# Patient Record
Sex: Female | Born: 1960 | Race: Black or African American | Hispanic: No | Marital: Single | Smoking: Never smoker
Health system: Southern US, Community
[De-identification: ages and names within clinical notes are randomized; demographics above are authoritative.]

## PROBLEM LIST (undated history)

## (undated) DIAGNOSIS — H401113 Primary open-angle glaucoma, right eye, severe stage: Secondary | ICD-10-CM

## (undated) DIAGNOSIS — M25473 Effusion, unspecified ankle: Secondary | ICD-10-CM

## (undated) DIAGNOSIS — K219 Gastro-esophageal reflux disease without esophagitis: Secondary | ICD-10-CM

## (undated) DIAGNOSIS — I10 Essential (primary) hypertension: Secondary | ICD-10-CM

## (undated) DIAGNOSIS — T7840XA Allergy, unspecified, initial encounter: Secondary | ICD-10-CM

## (undated) DIAGNOSIS — J45909 Unspecified asthma, uncomplicated: Secondary | ICD-10-CM

## (undated) HISTORY — PX: TUBAL LIGATION: SHX77

## (undated) HISTORY — DX: Allergy, unspecified, initial encounter: T78.40XA

## (undated) HISTORY — DX: Effusion, unspecified ankle: M25.473

## (undated) HISTORY — PX: ABDOMINAL SURGERY: SHX537

## (undated) HISTORY — DX: Primary open-angle glaucoma, right eye, severe stage: H40.1113

---

## 1988-12-20 HISTORY — PX: TUBAL LIGATION: SHX77

## 1996-12-20 HISTORY — PX: ABDOMINAL SURGERY: SHX537

## 1998-02-13 ENCOUNTER — Ambulatory Visit (HOSPITAL_COMMUNITY): Admission: RE | Admit: 1998-02-13 | Discharge: 1998-02-13 | Payer: Self-pay | Admitting: Obstetrics

## 1998-06-27 ENCOUNTER — Inpatient Hospital Stay (HOSPITAL_COMMUNITY): Admission: RE | Admit: 1998-06-27 | Discharge: 1998-06-29 | Payer: Self-pay | Admitting: Obstetrics

## 1999-09-10 ENCOUNTER — Other Ambulatory Visit: Admission: RE | Admit: 1999-09-10 | Discharge: 1999-09-10 | Payer: Self-pay | Admitting: Obstetrics

## 2001-01-04 ENCOUNTER — Other Ambulatory Visit: Admission: RE | Admit: 2001-01-04 | Discharge: 2001-01-04 | Payer: Self-pay | Admitting: Obstetrics

## 2001-03-29 ENCOUNTER — Encounter (INDEPENDENT_AMBULATORY_CARE_PROVIDER_SITE_OTHER): Payer: Self-pay | Admitting: *Deleted

## 2001-03-29 ENCOUNTER — Ambulatory Visit (HOSPITAL_BASED_OUTPATIENT_CLINIC_OR_DEPARTMENT_OTHER): Admission: RE | Admit: 2001-03-29 | Discharge: 2001-03-29 | Payer: Self-pay | Admitting: Orthopedic Surgery

## 2002-06-06 ENCOUNTER — Encounter: Payer: Self-pay | Admitting: Obstetrics

## 2002-06-06 ENCOUNTER — Ambulatory Visit (HOSPITAL_COMMUNITY): Admission: RE | Admit: 2002-06-06 | Discharge: 2002-06-06 | Payer: Self-pay | Admitting: Obstetrics

## 2005-01-19 ENCOUNTER — Encounter: Admission: RE | Admit: 2005-01-19 | Discharge: 2005-01-19 | Payer: Self-pay | Admitting: Obstetrics

## 2005-01-21 ENCOUNTER — Encounter: Admission: RE | Admit: 2005-01-21 | Discharge: 2005-01-21 | Payer: Self-pay | Admitting: Interventional Radiology

## 2005-01-26 ENCOUNTER — Ambulatory Visit (HOSPITAL_COMMUNITY): Admission: RE | Admit: 2005-01-26 | Discharge: 2005-01-26 | Payer: Self-pay | Admitting: Obstetrics

## 2005-02-09 ENCOUNTER — Ambulatory Visit (HOSPITAL_COMMUNITY): Admission: RE | Admit: 2005-02-09 | Discharge: 2005-02-09 | Payer: Self-pay | Admitting: Interventional Radiology

## 2005-02-11 ENCOUNTER — Observation Stay (HOSPITAL_COMMUNITY): Admission: RE | Admit: 2005-02-11 | Discharge: 2005-02-12 | Payer: Self-pay | Admitting: Interventional Radiology

## 2006-04-28 ENCOUNTER — Emergency Department (HOSPITAL_COMMUNITY): Admission: EM | Admit: 2006-04-28 | Discharge: 2006-04-28 | Payer: Self-pay | Admitting: Emergency Medicine

## 2008-01-29 ENCOUNTER — Emergency Department (HOSPITAL_COMMUNITY): Admission: EM | Admit: 2008-01-29 | Discharge: 2008-01-29 | Payer: Self-pay | Admitting: Family Medicine

## 2010-07-02 ENCOUNTER — Emergency Department (HOSPITAL_COMMUNITY)
Admission: EM | Admit: 2010-07-02 | Discharge: 2010-07-02 | Payer: Self-pay | Admitting: Physical Medicine and Rehabilitation

## 2011-01-10 ENCOUNTER — Encounter: Payer: Self-pay | Admitting: Obstetrics

## 2011-05-07 NOTE — Op Note (Signed)
[place]  Patient:    DESHARA, ROSSI                        MRN: [id number] Proc. Date: 03/29/01 Attending:  Artist Pais. Mina Marble, M.D.                           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  OPERATIVE PROCEDURE:  Scar revision right wrist as well as excision of recurrent dorsal ganglion cyst right wrist.  SURGEON:  Artist Pais. Mina Marble, M.D.  ASSISTANT:  Junius Roads. Ireton, P.A.C.  ANESTHESIA:  Bier block.  TOURNIQUET TIME:   40 minutes.  COMPLICATIONS:  None.  DRAINS/TUBES:  None.  SPECIMENS:  One specimen sent.  DESCRIPTION OF PROCEDURE:  The patient was taken to the operating room and after the induction of adequate Bier block analgesia, the right upper extremity was prepped and draped in the usual sterile fashion. At this point in time after adequate analgesia was obtained, a hypertrophic scar over the dorsal aspect of the right wrist was ellipsed out with undermining the skin edges to provide enough room for subsequent closure. At this point in time dissection was carried down to recurrent cyst overlying what appeared to be coming from the [facility] joint. Dissection was carried down. The cyst was significantly involved with the extensor carpi radialis brevis insertion. Dissection was carried down to the stalk which was excised. Once the stalk was excised the bipolar cautery was used to achieve hemostasis. The wound was thoroughly irrigated and then closed with a running 3-0 Prolene subcuticular stitch. Steri-Strips, 4 x 4s, fluffs and compressive dressing was applied. The patient had the specimen sent for pathologic confirmation as well as scar excision. The patient tolerated the procedure well and was placed in a well padded splint and a sterile dressing and went to the  recovery room in stable fashion. DD:  03/29/01 TD:  03/29/01 Job: [id number]

## 2012-07-06 ENCOUNTER — Other Ambulatory Visit (HOSPITAL_COMMUNITY)
Admission: RE | Admit: 2012-07-06 | Discharge: 2012-07-06 | Disposition: A | Discharge status: Home / Self Care | Payer: BC Managed Care – PPO | Source: Ambulatory Visit | Attending: Family Medicine | Admitting: Family Medicine

## 2012-07-06 ENCOUNTER — Other Ambulatory Visit: Payer: Self-pay | Admitting: Family Medicine

## 2012-07-06 DIAGNOSIS — Z1151 Encounter for screening for human papillomavirus (HPV): Secondary | ICD-10-CM | POA: Insufficient documentation

## 2012-07-06 DIAGNOSIS — Z01419 Encounter for gynecological examination (general) (routine) without abnormal findings: Secondary | ICD-10-CM | POA: Insufficient documentation

## 2013-03-28 ENCOUNTER — Other Ambulatory Visit: Payer: Self-pay | Admitting: Orthopedic Surgery

## 2013-03-28 ENCOUNTER — Ambulatory Visit
Admission: RE | Admit: 2013-03-28 | Discharge: 2013-03-28 | Disposition: A | Discharge status: Home / Self Care | Payer: BC Managed Care – PPO | Source: Ambulatory Visit | Attending: Orthopedic Surgery | Admitting: Orthopedic Surgery

## 2013-03-28 DIAGNOSIS — M25562 Pain in left knee: Secondary | ICD-10-CM

## 2013-03-28 DIAGNOSIS — M25561 Pain in right knee: Secondary | ICD-10-CM

## 2013-12-25 ENCOUNTER — Other Ambulatory Visit (HOSPITAL_COMMUNITY): Payer: Self-pay | Admitting: Family Medicine

## 2013-12-25 DIAGNOSIS — Z1231 Encounter for screening mammogram for malignant neoplasm of breast: Secondary | ICD-10-CM

## 2014-01-07 ENCOUNTER — Ambulatory Visit (HOSPITAL_COMMUNITY)
Admission: RE | Admit: 2014-01-07 | Discharge: 2014-01-07 | Disposition: A | Discharge status: Home / Self Care | Payer: BC Managed Care – PPO | Source: Ambulatory Visit | Attending: Family Medicine | Admitting: Family Medicine

## 2014-01-07 DIAGNOSIS — Z1231 Encounter for screening mammogram for malignant neoplasm of breast: Secondary | ICD-10-CM

## 2014-02-26 ENCOUNTER — Other Ambulatory Visit: Payer: Self-pay | Admitting: Family Medicine

## 2014-02-26 ENCOUNTER — Other Ambulatory Visit (HOSPITAL_COMMUNITY)
Admission: RE | Admit: 2014-02-26 | Discharge: 2014-02-26 | Disposition: A | Discharge status: Home / Self Care | Payer: BC Managed Care – PPO | Source: Ambulatory Visit | Attending: Family Medicine | Admitting: Family Medicine

## 2014-02-26 DIAGNOSIS — Z01419 Encounter for gynecological examination (general) (routine) without abnormal findings: Secondary | ICD-10-CM | POA: Insufficient documentation

## 2015-09-14 ENCOUNTER — Emergency Department (INDEPENDENT_AMBULATORY_CARE_PROVIDER_SITE_OTHER)
Admission: EM | Admit: 2015-09-14 | Discharge: 2015-09-14 | Disposition: A | Discharge status: Home / Self Care | Payer: BLUE CROSS/BLUE SHIELD | Source: Home / Self Care | Attending: Emergency Medicine | Admitting: Emergency Medicine

## 2015-09-14 ENCOUNTER — Encounter (HOSPITAL_COMMUNITY): Payer: Self-pay | Admitting: *Deleted

## 2015-09-14 DIAGNOSIS — T783XXA Angioneurotic edema, initial encounter: Secondary | ICD-10-CM

## 2015-09-14 HISTORY — DX: Gastro-esophageal reflux disease without esophagitis: K21.9

## 2015-09-14 HISTORY — DX: Essential (primary) hypertension: I10

## 2015-09-14 HISTORY — DX: Unspecified asthma, uncomplicated: J45.909

## 2015-09-14 MED ORDER — METHYLPREDNISOLONE SODIUM SUCC 125 MG IJ SOLR
125.0000 mg | Freq: Once | INTRAMUSCULAR | Status: AC
  Administered 2015-09-14: 125 mg via INTRAMUSCULAR

## 2015-09-14 MED ORDER — METHYLPREDNISOLONE SODIUM SUCC 125 MG IJ SOLR
INTRAMUSCULAR | Status: AC
  Filled 2015-09-14: qty 2

## 2015-09-14 MED ORDER — PREDNISONE 10 MG PO TABS: ORAL_TABLET | ORAL | Status: DC

## 2015-09-14 MED ORDER — HYDROCHLOROTHIAZIDE 25 MG PO TABS: 25.0000 mg | ORAL_TABLET | Freq: Every day | ORAL | Status: DC

## 2015-09-14 MED ORDER — RANITIDINE HCL 150 MG PO CAPS: 150.0000 mg | ORAL_CAPSULE | Freq: Two times a day (BID) | ORAL | Status: DC

## 2015-09-14 MED ORDER — EPINEPHRINE 0.3 MG/0.3ML IJ SOAJ: 0.3000 mg | INTRAMUSCULAR | Status: DC | PRN

## 2015-09-14 MED ORDER — DESLORATADINE 5 MG PO TABS: 5.0000 mg | ORAL_TABLET | Freq: Every day | ORAL | Status: DC

## 2015-09-14 NOTE — Discharge Instructions (Signed)
You are having a reaction called angioedema. This may be from your lisinopril. Please stop the lisinopril. I've provided a prescription for hydrochlorothiazide to take daily. Continue the amlodipine. Please take the prednisone as prescribed. Take Clarinex daily for the next week. Take Zantac twice a day for the next week. Please follow-up with your doctor tomorrow or Tuesday. If you develop swelling of your mouth or throat, use the EpiPen and go directly to the emergency room.   Angioedema Angioedema is sudden puffiness (swelling), often of the skin. It can happen:  On your face or privates (genitals).  In your belly (abdomen) or other body parts. It usually happens quickly and gets better in 1 or 2 days. It often starts at night and is found when you wake up. You may get red, itchy patches of skin (hives). Attacks can be dangerous if your breathing passages get puffy. The condition may happen only once, or it can come back at random times. It may happen for several years before it goes away for good. HOME CARE  Only take medicines as told by your doctor.  Always carry your emergency allergy medicines with you.  Wear a medical bracelet as told by your doctor.  Avoid things that you know will cause attacks (triggers). GET HELP IF:  You have another attack.  Your attacks happen more often or get worse.  The condition was passed to you by your parents and you want to have children. GET HELP RIGHT AWAY IF:   Your mouth, tongue, or lips are very puffy.  You have trouble breathing.  You have trouble swallowing.  You pass out (faint). MAKE SURE YOU:   Understand these instructions.  Will watch your condition.  Will get help right away if you are not doing well or get worse. Document Released: 11/24/2009 Document Revised: 09/26/2013 Document Reviewed: 07/30/2013 [facility] Patient Information 2015 [place], [place]. This information is not intended to replace advice given to  you by your health care provider. Make sure you discuss any questions you have with your health care provider.

## 2015-09-14 NOTE — ED Provider Notes (Signed)
CSN: [id number]     Arrival date & time 09/14/15  1721 History   First MD Initiated Contact with Patient 09/14/15 1745     Chief Complaint  Patient presents with  . Angioedema   (Consider location/radiation/quality/duration/timing/severity/associated sxs/prior Treatment) HPI She is a 54 year old woman here for evaluation of facial swelling. She states this started this morning with swelling of her left cheek and jaw area. It progressed to involve the left side of her lower and upper lips. She denies any tongue swelling or difficulty with her airway. She has tried taking Benadryl without improvement. No new medications, but her amlodipine was increased from 5 mg daily to 10 mg daily 2 days ago. She is on a lisinopril/HCTZ combo pill.  Past Medical History  Diagnosis Date  . Hypertension   . Asthma   . GERD (gastroesophageal reflux disease)    History reviewed. No pertinent past surgical history. No family history on file. Social History  Substance Use Topics  . Smoking status: Never Smoker   . Smokeless tobacco: None  . Alcohol Use: Yes     Comment: occasional   OB History    No data available     Review of Systems As in history of present illness Allergies  Review of patient's allergies indicates no known allergies.  Home Medications   Prior to Admission medications   Medication Sig Start Date End Date Taking? Authorizing Provider  AMLODIPINE BESYLATE PO Take by mouth. unk dose; recent increase in dose 2 days ago   Yes Historical Provider, MD  Budesonide-Formoterol Fumarate (SYMBICORT IN) Inhale 2 puffs into the lungs 2 (two) times daily.   Yes Historical Provider, MD  desloratadine (CLARINEX) 5 MG tablet Take 1 tablet (5 mg total) by mouth daily. 09/14/15   Melony Overly, MD  EPINEPHrine 0.3 mg/0.3 mL IJ SOAJ injection Inject 0.3 mLs (0.3 mg total) into the muscle as needed. 09/14/15   Melony Overly, MD  hydrochlorothiazide (HYDRODIURIL) 25 MG tablet Take 1 tablet (25 mg  total) by mouth daily. 09/14/15   Melony Overly, MD  predniSONE (DELTASONE) 10 MG tablet Day 1: 4 tablets Day 2: 4 tablets Day 3: 3 tablets Day 4: 2 tablets Day 5: 1 tablet 09/14/15   Melony Overly, MD  ranitidine (ZANTAC) 150 MG capsule Take 1 capsule (150 mg total) by mouth 2 (two) times daily. 09/14/15   Melony Overly, MD   Meds Ordered and Administered this Visit   Medications  methylPREDNISolone sodium succinate (SOLU-MEDROL) 125 mg/2 mL injection 125 mg (not administered)    BP 154/72 mmHg  Pulse 82  Temp(Src) 98 F (36.7 C) (Oral)  Resp 22  SpO2 99%  LMP  (Approximate) No data found.   Physical Exam  Constitutional: She is oriented to person, place, and time. She appears well-developed and well-nourished. No distress.  HENT:  Mouth/Throat: Oropharynx is clear and moist. No oropharyngeal exudate.  There is swelling to her left cheek, left lower lip, and left upper lip. No tongue or uvula swelling. Her airway is widely patent.  Neck: Neck supple.  Cardiovascular: Normal rate.   Pulmonary/Chest: Effort normal.  Neurological: She is alert and oriented to person, place, and time.    ED Course  Procedures (including critical care time)  Labs Review Labs Reviewed - No data to display  Imaging Review No results found.    MDM   1. Angioedema of lips, initial encounter    Discussed that this is most typically  caused by ace-inhibitors. I've instructed her to stop her lisinopril. I provided a prescription for hydrochlorothiazide 25 mg tablets. Solu-Medrol given here. Discharged with prednisone taper, Clarinex, and Zantac. I also provided a prescription for an EpiPen to be used if she develops swelling of her tongue or airway. Strict return precautions reviewed. Follow-up with PCP in 2-3 days for recheck.    Melony Overly, MD 09/14/15 [phone]

## 2015-09-14 NOTE — ED Notes (Signed)
Started with left facial swelling late this morning, which has progressed into left side of upper and lower lips.  Denies any tongue swelling, denies any throat swelling or unusual oral sensations or pruritis.  Took 50mg  Benadryl @ 1100, another 25mg  @ 1230, and another 25mg  @ 1500 without relief.  Pt takes lisinopril and amlodipine; amlodipine dose was increased by PCP 2 days ago.

## 2015-12-05 ENCOUNTER — Other Ambulatory Visit: Payer: Self-pay | Admitting: Allergy and Immunology

## 2015-12-26 ENCOUNTER — Other Ambulatory Visit: Payer: Self-pay | Admitting: Allergy and Immunology

## 2016-02-03 ENCOUNTER — Encounter (HOSPITAL_COMMUNITY): Payer: Self-pay | Admitting: Neurology

## 2016-02-03 ENCOUNTER — Ambulatory Visit: Payer: Self-pay | Admitting: Allergy and Immunology

## 2016-02-03 ENCOUNTER — Emergency Department (HOSPITAL_COMMUNITY)
Admission: EM | Admit: 2016-02-03 | Discharge: 2016-02-03 | Disposition: A | Discharge status: Home / Self Care | Payer: BLUE CROSS/BLUE SHIELD | Attending: Emergency Medicine | Admitting: Emergency Medicine

## 2016-02-03 DIAGNOSIS — Z79899 Other long term (current) drug therapy: Secondary | ICD-10-CM | POA: Diagnosis not present

## 2016-02-03 DIAGNOSIS — K219 Gastro-esophageal reflux disease without esophagitis: Secondary | ICD-10-CM | POA: Insufficient documentation

## 2016-02-03 DIAGNOSIS — I10 Essential (primary) hypertension: Secondary | ICD-10-CM | POA: Diagnosis not present

## 2016-02-03 DIAGNOSIS — Z7952 Long term (current) use of systemic steroids: Secondary | ICD-10-CM | POA: Diagnosis not present

## 2016-02-03 DIAGNOSIS — J4521 Mild intermittent asthma with (acute) exacerbation: Secondary | ICD-10-CM | POA: Insufficient documentation

## 2016-02-03 DIAGNOSIS — R0602 Shortness of breath: Secondary | ICD-10-CM | POA: Diagnosis present

## 2016-02-03 MED ORDER — ALBUTEROL SULFATE (2.5 MG/3ML) 0.083% IN NEBU
5.0000 mg | INHALATION_SOLUTION | Freq: Once | RESPIRATORY_TRACT | Status: AC
  Administered 2016-02-03: 5 mg via RESPIRATORY_TRACT

## 2016-02-03 MED ORDER — ALBUTEROL SULFATE (2.5 MG/3ML) 0.083% IN NEBU
INHALATION_SOLUTION | RESPIRATORY_TRACT | Status: DC
Start: 2016-02-03 — End: 2016-02-03
  Filled 2016-02-03: qty 6

## 2016-02-03 MED ORDER — PREDNISONE 20 MG PO TABS: 40.0000 mg | ORAL_TABLET | Freq: Every day | ORAL | Status: DC

## 2016-02-03 NOTE — ED Notes (Signed)
Pt reports URI, has been having asthma flares since last week. This morning felt sob, used symbicort and albuterol. Pt is able to speak in complete sentences 96% RA. Lung sounds clear.

## 2016-02-03 NOTE — ED Provider Notes (Signed)
CSN: [id number]     Arrival date & time 02/03/16  [id number] History  By signing my name below, I, Evelene Croon, attest that this documentation has been prepared under the direction and in the presence of non-physician practitioner, Montine Circle, PA-C. Electronically Signed: Evelene Croon, Scribe. 02/03/2016. 10:00 AM.    Chief Complaint  Patient presents with  . Asthma    The history is provided by the patient. No language interpreter was used.   HPI Comments:  Amy Combs is a 55 y.o. female with a history of asthma, who presents to the Emergency Department complaining of SOB with associated chest tightness which began 1 week ago but worsened this am. She denies CP specifically and  notes her symptoms today are typical of her asthma exacerbations. Pt used inhaler with minimal relief. She denies any productive cough or fever.  Past Medical History  Diagnosis Date  . Hypertension   . Asthma   . GERD (gastroesophageal reflux disease)    History reviewed. No pertinent past surgical history. No family history on file. Social History  Substance Use Topics  . Smoking status: Never Smoker   . Smokeless tobacco: None  . Alcohol Use: Yes     Comment: occasional   OB History    No data available     Review of Systems  Constitutional: Negative for fever and chills.  Respiratory: Positive for chest tightness and shortness of breath.   Cardiovascular: Negative for chest pain.     Allergies  Review of patient's allergies indicates no known allergies.  Home Medications   Prior to Admission medications   Medication Sig Start Date End Date Taking? Authorizing Provider  AMLODIPINE BESYLATE PO Take by mouth. unk dose; recent increase in dose 2 days ago    Historical Provider, MD  Budesonide-Formoterol Fumarate (SYMBICORT IN) Inhale 2 puffs into the lungs 2 (two) times daily.    Historical Provider, MD  desloratadine (CLARINEX) 5 MG tablet Take 1 tablet (5 mg total) by mouth daily.  09/14/15   Melony Overly, MD  EPINEPHrine 0.3 mg/0.3 mL IJ SOAJ injection Inject 0.3 mLs (0.3 mg total) into the muscle as needed. 09/14/15   Melony Overly, MD  hydrochlorothiazide (HYDRODIURIL) 25 MG tablet Take 1 tablet (25 mg total) by mouth daily. 09/14/15   Melony Overly, MD  pantoprazole (PROTONIX) 40 MG tablet TAKE 1 TABLET EVERY MORNING FOR REFLUX 12/05/15   Jiles Prows, MD  predniSONE (DELTASONE) 10 MG tablet Day 1: 4 tablets Day 2: 4 tablets Day 3: 3 tablets Day 4: 2 tablets Day 5: 1 tablet 09/14/15   Melony Overly, MD  ranitidine (ZANTAC) 150 MG capsule Take 1 capsule (150 mg total) by mouth 2 (two) times daily. 09/14/15   Melony Overly, MD  ranitidine (ZANTAC) 300 MG tablet TAKE 1 TABLET EVERY EVENING AS DIRECTED FOR REFLUX 12/05/15   Jiles Prows, MD  SYMBICORT 160-4.5 MCG/ACT inhaler USE 2 INHALATIONS TWICE A DAY TO PREVENT COUGH AND WHEEZING, RINSE, GARGLE, AND SPIT AFTER USE 12/26/15   Jiles Prows, MD   BP 157/95 mmHg  Pulse 83  Temp(Src) 97.7 F (36.5 C) (Oral)  Resp 18  SpO2 96% Physical Exam  Constitutional: She is oriented to person, place, and time. She appears well-developed and well-nourished. No distress.  HENT:  Head: Normocephalic and atraumatic.  Eyes: Conjunctivae are normal.  Cardiovascular: Normal rate and normal heart sounds.   Pulmonary/Chest: Effort normal and breath sounds normal. No respiratory  distress. She has no wheezes. She has no rales.  No wheezing. CTA bilaterally.  Abdominal: She exhibits no distension.  Neurological: She is alert and oriented to person, place, and time.  Skin: Skin is warm and dry.  Psychiatric: She has a normal mood and affect.  Nursing note and vitals reviewed.   ED Course  Procedures   DIAGNOSTIC STUDIES:  Oxygen Saturation is 96% on RA, normal by my interpretation.    COORDINATION OF CARE:  10:12 AM Pt feels better after neb tx. Discussed treatment plan with pt at bedside and pt agreed to plan.  MDM   Final  diagnoses:  Asthma, mild intermittent, with acute exacerbation    Patient with mild signs and symptoms of asthma/RAD. Oxygen saturation is above 90%. No accessory muscle use, no cyanosis. Pt treated in the ED and feels improved after treatment. Will discharge with prednisone. Pt instructed to follow up with PCP. Patient hemodynamically stable. Discussed return precautions. Pt appears safe for discharge.    I personally performed the services described in this documentation, which was scribed in my presence. The recorded information has been reviewed and is accurate.      Montine Circle, PA-C 02/03/16 1038  Elnora Morrison, MD 02/03/16 [phone]

## 2016-02-03 NOTE — Discharge Instructions (Signed)

## 2016-02-10 ENCOUNTER — Ambulatory Visit (INDEPENDENT_AMBULATORY_CARE_PROVIDER_SITE_OTHER): Payer: BLUE CROSS/BLUE SHIELD | Admitting: Allergy and Immunology

## 2016-02-10 ENCOUNTER — Encounter: Payer: Self-pay | Admitting: Allergy and Immunology

## 2016-02-10 VITALS — BP 120/78 | HR 72 | Resp 16 | Ht 66.14 in | Wt 230.8 lb

## 2016-02-10 DIAGNOSIS — J309 Allergic rhinitis, unspecified: Secondary | ICD-10-CM

## 2016-02-10 DIAGNOSIS — J4540 Moderate persistent asthma, uncomplicated: Secondary | ICD-10-CM | POA: Diagnosis not present

## 2016-02-10 DIAGNOSIS — J387 Other diseases of larynx: Secondary | ICD-10-CM

## 2016-02-10 DIAGNOSIS — H1010 Acute atopic conjunctivitis, unspecified eye: Secondary | ICD-10-CM | POA: Diagnosis not present

## 2016-02-10 DIAGNOSIS — K219 Gastro-esophageal reflux disease without esophagitis: Secondary | ICD-10-CM

## 2016-02-10 MED ORDER — MONTELUKAST SODIUM 10 MG PO TABS: 10.0000 mg | ORAL_TABLET | Freq: Every day | ORAL | Status: DC

## 2016-02-10 MED ORDER — ALBUTEROL SULFATE (2.5 MG/3ML) 0.083% IN NEBU: INHALATION_SOLUTION | RESPIRATORY_TRACT | Status: DC

## 2016-02-10 MED ORDER — ALBUTEROL SULFATE HFA 108 (90 BASE) MCG/ACT IN AERS: INHALATION_SPRAY | RESPIRATORY_TRACT | Status: DC

## 2016-02-10 NOTE — Patient Instructions (Addendum)
  1. Symbicort 160 2 inhalations twice a day  2. Montelukast 10 mg one tablet one time per day  3. Continue pantoprazole 40 mg in the morning and ranitidine 300 mg in evening  4. Continue ProAir HFA or albuterol nebulization every 4-6 hours if needed  5. "Action plan" for asthma flare up:   A. continue Symbicort 160 2 inhalations twice a day  B. add Qvar 80 3 inhalations 3 times a day  C. use ProAir HFA or albuterol nebulization if needed  6. Annual fall flu vaccine every year  7. Return to clinic in 3 months or earlier if problem

## 2016-02-10 NOTE — Progress Notes (Signed)
Follow-up Note  Referring Provider: No ref. provider found Primary Provider: Elyn Peers, MD Date of Office Visit: 02/10/2016  Subjective:   Amy Combs (DOB: 1961-09-15) is a 55 y.o. female who returns to the [facility] [place] on 02/10/2016 in re-evaluation of the following:  HPI Comments: Amy Combs presents to this clinic in reevaluation of her asthma and allergic rhinoconjunctivitis and LPR. She had an exacerbation of her asthma requiring her to go to the emergency room last week manifested as wheezing and coughing and nonresponsiveness to her short-acting bronchodilator. That was her first major flareups requiring a systemic steroid over the course of the past 6 months. She responded quite well to systemic steroids and is much better today. There was no obvious trigger giving rise to that flareup although she did have problems with rhinitis. She's been consistently using her Symbicort but did not have any additional inhaled steroid to add and when she flared up. She continues on pantoprazole and ranitidine for her reflux which is under very good control. She does not use a nasal steroid by preference at this point in time but overall she thinks her nose is doing relatively well. She would like to get a nebulizer for home.    Current outpatient prescriptions:  .  albuterol (PROAIR HFA) 108 (90 Base) MCG/ACT inhaler, Inhale two puffs every four to six hours as needed for cough or wheeze., Disp: , Rfl:  .  amLODipine (NORVASC) 10 MG tablet, Take 10 mg by mouth daily., Disp: , Rfl:  .  EDARBYCLOR 40-25 MG TABS, Take 1 tablet by mouth daily., Disp: , Rfl:  .  indapamide (LOZOL) 2.5 MG tablet, Take 2.5 mg by mouth daily., Disp: , Rfl: 3 .  latanoprost (XALATAN) 0.005 % ophthalmic solution, , Disp: , Rfl:  .  Multiple Vitamins-Minerals (ONE DAILY FOR WOMEN PO), Take by mouth daily., Disp: , Rfl:  .  pantoprazole (PROTONIX) 40 MG tablet, TAKE 1 TABLET EVERY MORNING FOR REFLUX,  Disp: 90 tablet, Rfl: 0 .  ranitidine (ZANTAC) 300 MG tablet, TAKE 1 TABLET EVERY EVENING AS DIRECTED FOR REFLUX, Disp: 90 tablet, Rfl: 0 .  SYMBICORT 160-4.5 MCG/ACT inhaler, USE 2 INHALATIONS TWICE A DAY TO PREVENT COUGH AND WHEEZING, RINSE, GARGLE, AND SPIT AFTER USE, Disp: 30.6 g, Rfl: 0 .  [DISCONTINUED] LISINOPRIL PO, Take by mouth., Disp: , Rfl:   Meds ordered this encounter  Medications  . albuterol (PROVENTIL) (2.5 MG/3ML) 0.083% nebulizer solution    Sig: Use one vial in nebulizer every 4-6 hours if needed for cough or wheeze    Dispense:  150 mL    Refill:  1  . montelukast (SINGULAIR) 10 MG tablet    Sig: Take 1 tablet (10 mg total) by mouth at bedtime.    Dispense:  30 tablet    Refill:  5  . albuterol (PROAIR HFA) 108 (90 Base) MCG/ACT inhaler    Sig: Inhale two puffs every four to six hours as needed for cough or wheeze.    Dispense:  3 Inhaler    Refill:  0    Past Medical History  Diagnosis Date  . Hypertension   . Asthma   . GERD (gastroesophageal reflux disease)     Past Surgical History  Procedure Laterality Date  . Tubal ligation      Allergies  Allergen Reactions  . Lisinopril Swelling    Review of systems negative except as noted in HPI / PMHx or noted below:  Review  of Systems  Constitutional: Negative.   HENT: Negative.   Eyes: Negative.   Respiratory: Negative.   Cardiovascular: Negative.   Gastrointestinal: Negative.   Genitourinary: Negative.   Musculoskeletal: Negative.   Skin: Negative.   Neurological: Negative.   Endo/Heme/Allergies: Negative.   Psychiatric/Behavioral: Negative.      Objective:   Filed Vitals:   02/10/16 1719  BP: 120/78  Pulse: 72  Resp: 16   Height: 5' 6.14" (168 cm)  Weight: 230 lb 13.2 oz (104.7 kg)   Physical Exam  Constitutional: She is well-developed, well-nourished, and in no distress.  HENT:  Head: Normocephalic.  Right Ear: Tympanic membrane, external ear and ear canal normal.  Left Ear:  Tympanic membrane, external ear and ear canal normal.  Nose: Nose normal. No mucosal edema or rhinorrhea.  Mouth/Throat: Uvula is midline, oropharynx is clear and moist and mucous membranes are normal. No oropharyngeal exudate.  Eyes: Conjunctivae are normal.  Neck: Trachea normal. No tracheal tenderness present. No tracheal deviation present. No thyromegaly present.  Cardiovascular: Normal rate, regular rhythm, S1 normal, S2 normal and normal heart sounds.   No murmur heard. Pulmonary/Chest: Breath sounds normal. No stridor. No respiratory distress. She has no wheezes. She has no rales.  Musculoskeletal: She exhibits no edema.  Lymphadenopathy:       Head (right side): No tonsillar adenopathy present.       Head (left side): No tonsillar adenopathy present.    She has no cervical adenopathy.    She has no axillary adenopathy.  Neurological: She is alert. Gait normal.  Skin: No rash noted. She is not diaphoretic. No erythema. Nails show no clubbing.  Psychiatric: Mood and affect normal.    Diagnostics:    Spirometry was performed and demonstrated an FEV1 of 1.70 at 77 % of predicted.  The patient had an Asthma Control Test with the following results: ACT Total Score: 14.    Assessment and Plan:   1. Asthma, moderate persistent, well-controlled   2. Allergic rhinoconjunctivitis   3. LPRD (laryngopharyngeal reflux disease)     1. Symbicort 160 2 inhalations twice a day  2. Montelukast 10 mg one tablet one time per day  3. Continue pantoprazole 40 mg in the morning and ranitidine 300 mg in evening  4. Continue ProAir HFA or albuterol nebulization every 4-6 hours if needed  5. "Action plan" for asthma flare up:   A. continue Symbicort 160 2 inhalations twice a day  B. add Qvar 80 3 inhalations 3 times a day  C. use ProAir HFA or albuterol nebulization if needed  6. Annual fall flu vaccine every year  7. Return to clinic in 3 months or earlier if problem  I will assume  that Amy Combs will do well as we progress through this upcoming springtime season on the plan mentioned above. We'll have her continue to use a combination of anti-inflammatory medications for her respiratory tract including Symbicort and montelukast and will add in high-dose inhaled steroid if she develops increased asthma activity in the future. She has a difficult time the spring we may need to have her reevaluated for different form of therapy including possible immunotherapy or biological agents. We'll see if things go over the course the next several months.  [name], MD [place]

## 2016-02-26 ENCOUNTER — Telehealth: Payer: Self-pay | Admitting: Allergy and Immunology

## 2016-02-26 NOTE — Telephone Encounter (Signed)
Received bill in mail, asking about the discount.

## 2016-02-27 NOTE — Telephone Encounter (Signed)
Left message to call me back.

## 2016-03-04 ENCOUNTER — Other Ambulatory Visit: Payer: Self-pay | Admitting: Allergy and Immunology

## 2016-03-16 NOTE — Telephone Encounter (Signed)
Sending payment - will give her discount on mm

## 2016-04-07 ENCOUNTER — Ambulatory Visit (HOSPITAL_COMMUNITY)
Admission: EM | Admit: 2016-04-07 | Discharge: 2016-04-07 | Disposition: A | Discharge status: Home / Self Care | Payer: BLUE CROSS/BLUE SHIELD | Attending: Emergency Medicine | Admitting: Emergency Medicine

## 2016-04-07 ENCOUNTER — Encounter (HOSPITAL_COMMUNITY): Payer: Self-pay | Admitting: Emergency Medicine

## 2016-04-07 DIAGNOSIS — R590 Localized enlarged lymph nodes: Secondary | ICD-10-CM

## 2016-04-07 DIAGNOSIS — R599 Enlarged lymph nodes, unspecified: Secondary | ICD-10-CM | POA: Diagnosis not present

## 2016-04-07 MED ORDER — CLINDAMYCIN HCL 300 MG PO CAPS: 300.0000 mg | ORAL_CAPSULE | Freq: Three times a day (TID) | ORAL | Status: DC

## 2016-04-07 NOTE — ED Notes (Signed)
Pt has a swollen area in front of left ear. Suspected swollen lymph nose. Area is tender to palpation. PT first noticed area on Sunday.

## 2016-04-07 NOTE — Discharge Instructions (Signed)
You have 2 enlarged lymph nodes by your ear. Take clindamycin 3 times a day for the next 10 days. This is to cover infection of the lymph nodes.  This will likely cause some loose stool or diarrhea while taking it. Please make an appointment to see your PCP in 1-2 weeks for a recheck. The swelling is getting worse, you develop fevers, or you have difficulty breathing or swallowing, please go to the emergency room.

## 2016-04-07 NOTE — ED Provider Notes (Signed)
CSN: [id number]     Arrival date & time 04/07/16  1308 History   First MD Initiated Contact with Patient 04/07/16 1347     Chief Complaint  Patient presents with  . Mass    Swollen area in front of left ear. Painful to palpation   (Consider location/radiation/quality/duration/timing/severity/associated sxs/prior Treatment) HPI She is a 55 year old woman here for evaluation of mass.  She first noticed some swelling in front of her left ear 3 days ago. It is tender to palpation. It seemed to get worse, but then wrote down again.She denies any fevers. No ear pain, nasal congestion, rhinorrhea, sore throat. No recent cold symptoms. No difficulty swallowing or breathing. She has not tried anything.  Past Medical History  Diagnosis Date  . Hypertension   . Asthma   . GERD (gastroesophageal reflux disease)    Past Surgical History  Procedure Laterality Date  . Tubal ligation     Family History  Problem Relation Age of Onset  . High blood pressure Mother   . High blood pressure Father    Social History  Substance Use Topics  . Smoking status: Never Smoker   . Smokeless tobacco: Never Used  . Alcohol Use: Yes     Comment: occasional   OB History    No data available     Review of Systems As in history of present illness Allergies  Lisinopril  Home Medications   Prior to Admission medications   Medication Sig Start Date End Date Taking? Authorizing Provider  amLODipine (NORVASC) 10 MG tablet Take 10 mg by mouth daily. 12/25/15  Yes Historical Provider, MD  EDARBYCLOR 40-25 MG TABS Take 1 tablet by mouth daily. 02/04/16  Yes Historical Provider, MD  montelukast (SINGULAIR) 10 MG tablet Take 1 tablet (10 mg total) by mouth at bedtime. 02/10/16  Yes Jiles Prows, MD  Multiple Vitamins-Minerals (ONE DAILY FOR WOMEN PO) Take by mouth daily.   Yes Historical Provider, MD  pantoprazole (PROTONIX) 40 MG tablet TAKE 1 TABLET EVERY MORNING FOR REFLUX 03/04/16  Yes Jiles Prows, MD   SYMBICORT 160-4.5 MCG/ACT inhaler USE 2 INHALATIONS TWICE A DAY TO PREVENT COUGH AND WHEEZING, RINSE, GARGLE, AND SPIT AFTER USE 12/26/15  Yes Jiles Prows, MD  albuterol (PROAIR HFA) 108 (90 Base) MCG/ACT inhaler Inhale two puffs every four to six hours as needed for cough or wheeze. 02/10/16   Jiles Prows, MD  albuterol (PROVENTIL) (2.5 MG/3ML) 0.083% nebulizer solution Use one vial in nebulizer every 4-6 hours if needed for cough or wheeze 02/10/16   Jiles Prows, MD  clindamycin (CLEOCIN) 300 MG capsule Take 1 capsule (300 mg total) by mouth 3 (three) times daily. 04/07/16   Melony Overly, MD  EPINEPHrine 0.3 mg/0.3 mL IJ SOAJ injection Inject 0.3 mLs (0.3 mg total) into the muscle as needed. 09/14/15   Melony Overly, MD  indapamide (LOZOL) 2.5 MG tablet Take 2.5 mg by mouth daily. 11/24/15   Historical Provider, MD  latanoprost (XALATAN) 0.005 % ophthalmic solution  12/05/15   Historical Provider, MD   Meds Ordered and Administered this Visit  Medications - No data to display  BP 152/85 mmHg  Pulse 83  Temp(Src) 98.3 F (36.8 C) (Oral)  Resp 18  Ht 5\' 9"  (1.753 m)  Wt 215 lb (97.523 kg)  BMI 31.74 kg/m2  SpO2 96%  LMP 02/27/2016 No data found.   Physical Exam  Constitutional: She is oriented to person, place, and time.  She appears well-developed and well-nourished. No distress.  HENT:  There are 2 slightly swollen and tender preauricular lymph node on the left. Parotid gland is normal. TMs normal bilaterally.  Cardiovascular: Normal rate.   Pulmonary/Chest: Effort normal.  Lymphadenopathy:    She has no cervical adenopathy.  Neurological: She is alert and oriented to person, place, and time.    ED Course  Procedures (including critical care time)  Labs Review Labs Reviewed - No data to display  Imaging Review No results found.   MDM   1. Preauricular lymphadenopathy    We'll treat for lymphadenitis with clindamycin. Recommend follow-up with PCP in 1-2 weeks to make  sure this is resolving. If symptoms change or worsen, she will go to the emergency room.    Melony Overly, MD 04/07/16 [phone]

## 2016-05-11 ENCOUNTER — Ambulatory Visit: Payer: BLUE CROSS/BLUE SHIELD | Admitting: Allergy and Immunology

## 2016-05-11 ENCOUNTER — Ambulatory Visit (INDEPENDENT_AMBULATORY_CARE_PROVIDER_SITE_OTHER): Payer: BLUE CROSS/BLUE SHIELD | Admitting: Allergy and Immunology

## 2016-05-11 ENCOUNTER — Encounter: Payer: Self-pay | Admitting: Allergy and Immunology

## 2016-05-11 VITALS — BP 130/82 | HR 76 | Resp 16

## 2016-05-11 DIAGNOSIS — R590 Localized enlarged lymph nodes: Secondary | ICD-10-CM

## 2016-05-11 DIAGNOSIS — H1010 Acute atopic conjunctivitis, unspecified eye: Secondary | ICD-10-CM | POA: Diagnosis not present

## 2016-05-11 DIAGNOSIS — J387 Other diseases of larynx: Secondary | ICD-10-CM

## 2016-05-11 DIAGNOSIS — J4540 Moderate persistent asthma, uncomplicated: Secondary | ICD-10-CM | POA: Diagnosis not present

## 2016-05-11 DIAGNOSIS — K219 Gastro-esophageal reflux disease without esophagitis: Secondary | ICD-10-CM

## 2016-05-11 DIAGNOSIS — J309 Allergic rhinitis, unspecified: Secondary | ICD-10-CM | POA: Diagnosis not present

## 2016-05-11 DIAGNOSIS — R599 Enlarged lymph nodes, unspecified: Secondary | ICD-10-CM | POA: Diagnosis not present

## 2016-05-11 NOTE — Patient Instructions (Addendum)
  1. Allergen avoidance measures  2. Montelukast 10 mg one tablet one time per day and Symbicort 160 2 inhalations twice a day  3. Continue pantoprazole 40 mg in the morning and ranitidine 300 mg in evening  4. Continue ProAir HFA or albuterol nebulization every 4-6 hours if needed  5. "Action plan" for asthma flare up:   A. continue Symbicort 160 2 inhalations twice a day  B. add Qvar 80 3 inhalations 3 times a day  C. use ProAir HFA or albuterol nebulization if needed  6. Annual fall flu vaccine every year  7. Return to clinic in 3 months or earlier if problem

## 2016-05-11 NOTE — Progress Notes (Signed)
Follow-up Note  Referring Provider: Lucianne Lei, MD Primary Provider: Elyn Peers, MD Date of Office Visit: 05/11/2016  Subjective:   Amy Combs (DOB: [birth year]) is a 55 y.o. female who returns to the [facility] on 05/11/2016 in re-evaluation of the following:  HPI: Amy Combs returns to this clinic in reevaluation of her asthma and allergic rhinoconjunctivitis and LPR. She's had a pretty good three-month period without the requirement for systemic steroid and without a bronchodilator requirement greater than twice a week while consistently using her medical therapy which includes Symbicort and montelukast. Her nose is been doing relatively well and she has not required any type of systemic steroid or antibiotic while consistently using her montelukast and occasional antihistamine. Her reflux and throat has been under excellent control as well while consistently using her pantoprazole and ranitidine.  She did have a rather strange reaction occur in the middle of April. She developed swelling in her preauricular area that was felt to be secondary to lymphadenopathy as assessed by the emergency room at [facility] on 04/07/2016. She subsequently developed very significant swelling of her face and periorbital region even while utilizing an antibiotic for possible infection. Fortunately, after about 7 or 10 days his reaction resolved and she is not had return of this issue.    Medication List           albuterol (2.5 MG/3ML) 0.083% nebulizer solution  Commonly known as:  PROVENTIL  Use one vial in nebulizer every 4-6 hours if needed for cough or wheeze     albuterol 108 (90 Base) MCG/ACT inhaler  Commonly known as:  PROAIR HFA  Inhale two puffs every four to six hours as needed for cough or wheeze.     amLODipine 10 MG tablet  Commonly known as:  NORVASC  Take 10 mg by mouth daily.     clindamycin 300 MG capsule  Commonly known as:  CLEOCIN  Take 1 capsule  (300 mg total) by mouth 3 (three) times daily.     EDARBYCLOR 40-25 MG Tabs  Generic drug:  Azilsartan-Chlorthalidone  Take 1 tablet by mouth daily.     EPINEPHrine 0.3 mg/0.3 mL Soaj injection  Commonly known as:  EPI-PEN  Inject 0.3 mLs (0.3 mg total) into the muscle as needed.     indapamide 2.5 MG tablet  Commonly known as:  LOZOL  Take 2.5 mg by mouth daily.     latanoprost 0.005 % ophthalmic solution  Commonly known as:  XALATAN     montelukast 10 MG tablet  Commonly known as:  SINGULAIR  Take 1 tablet (10 mg total) by mouth at bedtime.     ONE DAILY FOR WOMEN PO  Take by mouth daily.     pantoprazole 40 MG tablet  Commonly known as:  PROTONIX  TAKE 1 TABLET EVERY MORNING FOR REFLUX     SYMBICORT 160-4.5 MCG/ACT inhaler  Generic drug:  budesonide-formoterol  USE 2 INHALATIONS TWICE A DAY TO PREVENT COUGH AND WHEEZING, RINSE, GARGLE, AND SPIT AFTER USE        Past Medical History  Diagnosis Date  . Hypertension   . Asthma   . GERD (gastroesophageal reflux disease)     Past Surgical History  Procedure Laterality Date  . Tubal ligation      Allergies  Allergen Reactions  . Lisinopril Swelling    Review of systems negative except as noted in HPI / PMHx or noted below:  Review of Systems  Constitutional: Negative.   HENT: Negative.   Eyes: Negative.   Respiratory: Negative.   Cardiovascular: Negative.   Gastrointestinal: Negative.   Genitourinary: Negative.   Musculoskeletal: Negative.   Skin: Negative.   Neurological: Negative.   Endo/Heme/Allergies: Negative.   Psychiatric/Behavioral: Negative.      Objective:   Filed Vitals:   05/11/16 1344  BP: 130/82  Pulse: 76  Resp: 16          Physical Exam  Constitutional: She is well-developed, well-nourished, and in no distress.  HENT:  Head: Normocephalic.  Right Ear: Tympanic membrane, external ear and ear canal normal.  Left Ear: Tympanic membrane, external ear and ear canal normal.    Nose: Nose normal. No mucosal edema or rhinorrhea.  Mouth/Throat: Uvula is midline, oropharynx is clear and moist and mucous membranes are normal. No oropharyngeal exudate.  Eyes: Conjunctivae are normal.  Neck: Trachea normal. No tracheal tenderness present. No tracheal deviation present. No thyromegaly present.  Cardiovascular: Normal rate, regular rhythm, S1 normal, S2 normal and normal heart sounds.   No murmur heard. Pulmonary/Chest: Breath sounds normal. No stridor. No respiratory distress. She has no wheezes. She has no rales.  Musculoskeletal: She exhibits no edema.  Lymphadenopathy:       Head (right side): No tonsillar adenopathy present.       Head (left side): No tonsillar adenopathy present.    She has no cervical adenopathy.  Neurological: She is alert. Gait normal.  Skin: No rash noted. She is not diaphoretic. No erythema. Nails show no clubbing.  Psychiatric: Mood and affect normal.    Diagnostics: Allergy skin testing was performed. She demonstrated hypersensitivity to grasses, weeds, trees, Alternaria mold, and house dust mite.   Spirometry was performed and demonstrated an FEV1 of 1.86 at 80 % of predicted.  The patient had an Asthma Control Test with the following results:  .    Assessment and Plan:   1. Asthma, moderate persistent, well-controlled   2. Allergic rhinoconjunctivitis   3. LPRD (laryngopharyngeal reflux disease)   4. Preauricular lymphadenopathy     1. Allergen avoidance measures  2. Montelukast 10 mg one tablet one time per day and Symbicort 160 2 inhalations twice a day  3. Continue pantoprazole 40 mg in the morning and ranitidine 300 mg in evening  4. Continue ProAir HFA or albuterol nebulization every 4-6 hours if needed  5. "Action plan" for asthma flare up:   A. continue Symbicort 160 2 inhalations twice a day  B. add Qvar 80 3 inhalations 3 times a day  C. use ProAir HFA or albuterol nebulization if needed  6. Annual fall flu  vaccine every year  7. Return to clinic in 3 months or earlier if problem  Georgiann will perform allergen avoidance measures while consistently using anti-inflammatory medications in the form of montelukast and Symbicort and aggressive therapy directed against reflux with pantoprazole and ranitidine. She has had very significant problems with asthma in the past several years usually with an exacerbation twice a year requiring a systemic steroid even in the face of utilizing aggressive treatment as described above. She would be a candidate for immunotherapy or a biological agent and I've given her literature on both of these forms of therapy during today's visit and she's presently considering this option. Concerning her episode of preauricular lymphadenopathy, I did not see any significant lymphadenopathy at this point in time. There may be some mild fullness of her parotid gland but certainly no tenderness. I do  not know what gave rise to that issue but was most likely a viral syndrome and I do not think that she requires any further evaluation for that issue at this point in time and less of course it is recurrent.  [name], MD [place]

## 2016-06-27 ENCOUNTER — Other Ambulatory Visit: Payer: Self-pay | Admitting: Allergy and Immunology

## 2016-08-17 ENCOUNTER — Ambulatory Visit: Payer: BLUE CROSS/BLUE SHIELD | Admitting: Allergy and Immunology

## 2016-09-28 ENCOUNTER — Ambulatory Visit: Payer: BLUE CROSS/BLUE SHIELD | Admitting: Allergy and Immunology

## 2016-09-28 ENCOUNTER — Ambulatory Visit (INDEPENDENT_AMBULATORY_CARE_PROVIDER_SITE_OTHER): Payer: BLUE CROSS/BLUE SHIELD | Admitting: *Deleted

## 2016-09-28 DIAGNOSIS — Z23 Encounter for immunization: Secondary | ICD-10-CM

## 2016-09-28 DIAGNOSIS — J4540 Moderate persistent asthma, uncomplicated: Secondary | ICD-10-CM

## 2017-01-13 ENCOUNTER — Telehealth: Payer: Self-pay | Admitting: Allergy and Immunology

## 2017-01-13 MED ORDER — SYMBICORT 160-4.5 MCG/ACT IN AERO: 2.0000 | INHALATION_SPRAY | Freq: Two times a day (BID) | RESPIRATORY_TRACT | 0 refills | Status: DC

## 2017-01-13 NOTE — Telephone Encounter (Signed)
Pt called and needs to have her symibcort refilled through [facility]. [phone].

## 2017-01-13 NOTE — Telephone Encounter (Signed)
Pt transferred up front to make an appointment. 1 ref[facility] to [facility] [address].

## 2017-02-08 ENCOUNTER — Ambulatory Visit (INDEPENDENT_AMBULATORY_CARE_PROVIDER_SITE_OTHER): Payer: BLUE CROSS/BLUE SHIELD | Admitting: Allergy and Immunology

## 2017-02-08 ENCOUNTER — Encounter (INDEPENDENT_AMBULATORY_CARE_PROVIDER_SITE_OTHER): Payer: Self-pay

## 2017-02-08 ENCOUNTER — Encounter: Payer: Self-pay | Admitting: Allergy and Immunology

## 2017-02-08 VITALS — BP 136/72 | HR 84 | Resp 22

## 2017-02-08 DIAGNOSIS — H1010 Acute atopic conjunctivitis, unspecified eye: Secondary | ICD-10-CM | POA: Diagnosis not present

## 2017-02-08 DIAGNOSIS — J4540 Moderate persistent asthma, uncomplicated: Secondary | ICD-10-CM

## 2017-02-08 DIAGNOSIS — J309 Allergic rhinitis, unspecified: Secondary | ICD-10-CM

## 2017-02-08 DIAGNOSIS — K219 Gastro-esophageal reflux disease without esophagitis: Secondary | ICD-10-CM

## 2017-02-08 MED ORDER — SYMBICORT 160-4.5 MCG/ACT IN AERO: INHALATION_SPRAY | RESPIRATORY_TRACT | 3 refills | Status: DC

## 2017-02-08 NOTE — Patient Instructions (Addendum)
  1. Allergen avoidance measures  2. Montelukast 10 mg one tablet one time per day and Symbicort 160 2 inhalations twice a day  3. Continue pantoprazole 40 mg in the morning and ranitidine 300 mg in evening  4. Continue ProAir HFA or albuterol nebulization every 4-6 hours if needed  5. "Action plan" for asthma flare up:   A. continue Symbicort 160 2 inhalations twice a day  B. add Qvar 80 3 inhalations 3 times a day  C. use ProAir HFA or albuterol nebulization if needed  6. Annual fall flu vaccine every year  7. Return to clinic in 12 months or earlier if problem

## 2017-02-08 NOTE — Progress Notes (Signed)
Follow-up Note  Referring Provider: Lucianne Lei, MD Primary Provider: Elyn Peers, MD Date of Office Visit: 02/08/2017  Subjective:   Amy Combs (DOB: May 01, 1961) is a 56 y.o. female who returns to the [facility] on 02/08/2017 in re-evaluation of the following:  HPI: Amy Combs returns to this clinic in evaluation of her asthma and allergic rhinoconjunctivitis and LPR. I have not seen her in this clinic since February 2017.  Her asthma has been under excellent control. She has not required a systemic steroid to treat an exacerbation and she rarely uses a short acting bronchodilator and she can exercise without any problem. She continues on her Symbicort consistently and occasionally montelukast.  However, she was given Timolol eyedrops for her glaucoma and within a week develop problems with shortness of breath which resolved after discontinuing her timolol eyedrops. She is now scheduled to see a glaucoma specialist about having laser treatment.  She's had very little problems with her nose. She has not required any antibiotic to treat an episode of sinusitis.  Her reflux has been under excellent control. She does require both a proton pump inhibitor and an H2 receptor blocker for if she misses one of these agents she will develop reflux and coughing.  She did obtain a flu vaccine.  Allergies as of 02/08/2017      Reactions   Lisinopril Swelling      Medication List      albuterol (2.5 MG/3ML) 0.083% nebulizer solution Commonly known as:  PROVENTIL Use one vial in nebulizer every 4-6 hours if needed for cough or wheeze   albuterol 108 (90 Base) MCG/ACT inhaler Commonly known as:  PROAIR HFA Inhale two puffs every four to six hours as needed for cough or wheeze.   amLODipine 10 MG tablet Commonly known as:  NORVASC Take 10 mg by mouth daily.   EPINEPHrine 0.3 mg/0.3 mL Soaj injection Commonly known as:  EPI-PEN Inject 0.3 mLs (0.3 mg total) into  the muscle as needed.   indapamide 2.5 MG tablet Commonly known as:  LOZOL Take 2.5 mg by mouth daily.   latanoprost 0.005 % ophthalmic solution Commonly known as:  XALATAN   montelukast 10 MG tablet Commonly known as:  SINGULAIR Take 1 tablet (10 mg total) by mouth at bedtime.   ONE DAILY FOR WOMEN PO Take by mouth daily.   pantoprazole 40 MG tablet Commonly known as:  PROTONIX TAKE 1 TABLET EVERY MORNING FOR REFLUX   QVAR 80 MCG/ACT inhaler Generic drug:  beclomethasone Inhale three puffs three times daily during flare-up.  Rinse, gargle, and spit after use.   ranitidine 300 MG tablet Commonly known as:  ZANTAC Take 300 mg by mouth at bedtime.   SYMBICORT 160-4.5 MCG/ACT inhaler Generic drug:  budesonide-formoterol Inhale 2 puffs into the lungs 2 (two) times daily.       Past Medical History:  Diagnosis Date  . Asthma   . GERD (gastroesophageal reflux disease)   . Hypertension     Past Surgical History:  Procedure Laterality Date  . TUBAL LIGATION      Review of systems negative except as noted in HPI / PMHx or noted below:  Review of Systems  Constitutional: Negative.   HENT: Negative.   Eyes: Negative.   Respiratory: Negative.   Cardiovascular: Negative.   Gastrointestinal: Negative.   Genitourinary: Negative.   Musculoskeletal: Negative.   Skin: Negative.   Neurological: Negative.   Endo/Heme/Allergies: Negative.   Psychiatric/Behavioral: Negative.  Objective:   Vitals:   02/08/17 1716  BP: 136/72  Pulse: 84  Resp: (!) 22          Physical Exam  Constitutional: She is well-developed, well-nourished, and in no distress.  HENT:  Head: Normocephalic.  Right Ear: Tympanic membrane, external ear and ear canal normal.  Left Ear: Tympanic membrane, external ear and ear canal normal.  Nose: Nose normal. No mucosal edema or rhinorrhea.  Mouth/Throat: Uvula is midline, oropharynx is clear and moist and mucous membranes are normal. No  oropharyngeal exudate.  Eyes: Conjunctivae are normal.  Neck: Trachea normal. No tracheal tenderness present. No tracheal deviation present. No thyromegaly present.  Cardiovascular: Normal rate, regular rhythm, S1 normal, S2 normal and normal heart sounds.   No murmur heard. Pulmonary/Chest: Breath sounds normal. No stridor. No respiratory distress. She has no wheezes. She has no rales.  Musculoskeletal: She exhibits no edema.  Lymphadenopathy:       Head (right side): No tonsillar adenopathy present.       Head (left side): No tonsillar adenopathy present.    She has no cervical adenopathy.  Neurological: She is alert. Gait normal.  Skin: No rash noted. She is not diaphoretic. No erythema. Nails show no clubbing.  Psychiatric: Mood and affect normal.    Diagnostics:    Spirometry was performed and demonstrated an FEV1 of 1.74 at 76 % of predicted.   Assessment and Plan:   1. Moderate persistent asthma without complication   2. Allergic rhinoconjunctivitis   3. LPRD (laryngopharyngeal reflux disease)     1. Allergen avoidance measures  2. Montelukast 10 mg one tablet one time per day and Symbicort 160 2 inhalations twice a day  3. Continue pantoprazole 40 mg in the morning and ranitidine 300 mg in evening  4. Continue ProAir HFA or albuterol nebulization every 4-6 hours if needed  5. "Action plan" for asthma flare up:   A. continue Symbicort 160 2 inhalations twice a day  B. add Qvar 80 3 inhalations 3 times a day  C. use ProAir HFA or albuterol nebulization if needed  6. Annual fall flu vaccine every year  7. Return to clinic in 12 months or earlier if problem  Amy Combs appears to be doing relatively well and I have refilled all of her medications and I will assume that over the course of the next year she will not have any significant problems. I did provide her an action plan to utilize if she develops an asthma flare in the future. Based upon her previous experience  with timolol eyedrops she should not have a beta blocker at any point in time.  [name], MD Allergy / Immunology [place]

## 2017-06-16 ENCOUNTER — Other Ambulatory Visit: Payer: Self-pay | Admitting: Family Medicine

## 2017-06-16 DIAGNOSIS — Z1231 Encounter for screening mammogram for malignant neoplasm of breast: Secondary | ICD-10-CM

## 2017-06-27 ENCOUNTER — Ambulatory Visit: Payer: BLUE CROSS/BLUE SHIELD

## 2017-06-28 ENCOUNTER — Other Ambulatory Visit: Payer: Self-pay | Admitting: Allergy and Immunology

## 2017-06-28 MED ORDER — RANITIDINE HCL 300 MG PO TABS: 300.0000 mg | ORAL_TABLET | Freq: Every day | ORAL | 2 refills | Status: DC

## 2017-06-28 MED ORDER — PANTOPRAZOLE SODIUM 40 MG PO TBEC: 40.0000 mg | DELAYED_RELEASE_TABLET | Freq: Every morning | ORAL | 2 refills | Status: DC

## 2017-06-28 NOTE — Telephone Encounter (Signed)
patient needs a refill on both acid reflux mediations [facility] [address]

## 2017-09-26 ENCOUNTER — Ambulatory Visit
Admission: RE | Admit: 2017-09-26 | Discharge: 2017-09-26 | Disposition: A | Discharge status: Home / Self Care | Payer: BLUE CROSS/BLUE SHIELD | Source: Ambulatory Visit | Attending: Family Medicine | Admitting: Family Medicine

## 2017-09-26 DIAGNOSIS — Z1231 Encounter for screening mammogram for malignant neoplasm of breast: Secondary | ICD-10-CM

## 2017-09-30 DIAGNOSIS — H25813 Combined forms of age-related cataract, bilateral: Secondary | ICD-10-CM | POA: Insufficient documentation

## 2017-10-27 DIAGNOSIS — M25561 Pain in right knee: Secondary | ICD-10-CM | POA: Diagnosis not present

## 2017-10-27 DIAGNOSIS — M25562 Pain in left knee: Secondary | ICD-10-CM | POA: Diagnosis not present

## 2017-11-14 DIAGNOSIS — H401121 Primary open-angle glaucoma, left eye, mild stage: Secondary | ICD-10-CM | POA: Diagnosis not present

## 2017-11-14 DIAGNOSIS — H401113 Primary open-angle glaucoma, right eye, severe stage: Secondary | ICD-10-CM | POA: Diagnosis not present

## 2017-11-14 DIAGNOSIS — H25813 Combined forms of age-related cataract, bilateral: Secondary | ICD-10-CM | POA: Diagnosis not present

## 2018-01-02 DIAGNOSIS — H401113 Primary open-angle glaucoma, right eye, severe stage: Secondary | ICD-10-CM | POA: Insufficient documentation

## 2018-01-04 DIAGNOSIS — J45909 Unspecified asthma, uncomplicated: Secondary | ICD-10-CM | POA: Diagnosis not present

## 2018-01-04 DIAGNOSIS — K219 Gastro-esophageal reflux disease without esophagitis: Secondary | ICD-10-CM | POA: Diagnosis not present

## 2018-01-04 DIAGNOSIS — I10 Essential (primary) hypertension: Secondary | ICD-10-CM | POA: Diagnosis not present

## 2018-01-04 DIAGNOSIS — H401113 Primary open-angle glaucoma, right eye, severe stage: Secondary | ICD-10-CM | POA: Diagnosis not present

## 2018-01-04 DIAGNOSIS — Z79899 Other long term (current) drug therapy: Secondary | ICD-10-CM | POA: Diagnosis not present

## 2018-01-31 ENCOUNTER — Other Ambulatory Visit: Payer: Self-pay | Admitting: Allergy and Immunology

## 2018-02-09 DIAGNOSIS — M130 Polyarthritis, unspecified: Secondary | ICD-10-CM | POA: Diagnosis not present

## 2018-02-09 DIAGNOSIS — E6609 Other obesity due to excess calories: Secondary | ICD-10-CM | POA: Diagnosis not present

## 2018-02-09 DIAGNOSIS — I10 Essential (primary) hypertension: Secondary | ICD-10-CM | POA: Diagnosis not present

## 2018-02-09 DIAGNOSIS — R591 Generalized enlarged lymph nodes: Secondary | ICD-10-CM | POA: Diagnosis not present

## 2018-02-09 DIAGNOSIS — R635 Abnormal weight gain: Secondary | ICD-10-CM | POA: Diagnosis not present

## 2018-02-09 DIAGNOSIS — L209 Atopic dermatitis, unspecified: Secondary | ICD-10-CM | POA: Diagnosis not present

## 2018-02-15 DIAGNOSIS — H25813 Combined forms of age-related cataract, bilateral: Secondary | ICD-10-CM | POA: Diagnosis not present

## 2018-02-15 DIAGNOSIS — H401113 Primary open-angle glaucoma, right eye, severe stage: Secondary | ICD-10-CM | POA: Diagnosis not present

## 2018-02-15 DIAGNOSIS — H401121 Primary open-angle glaucoma, left eye, mild stage: Secondary | ICD-10-CM | POA: Diagnosis not present

## 2018-02-23 ENCOUNTER — Other Ambulatory Visit: Payer: Self-pay | Admitting: Allergy and Immunology

## 2018-02-24 ENCOUNTER — Telehealth: Payer: Self-pay

## 2018-02-24 MED ORDER — POLYMYXIN B-TRIMETHOPRIM 10000-0.1 UNIT/ML-% OP SOLN: 1.0000 [drp] | OPHTHALMIC | 0 refills | Status: DC

## 2018-02-24 NOTE — Telephone Encounter (Signed)
Patient called and stated that she woke up this morning with gunk and crusted in both eyes. She stated that they are watery and red. Patient is wanting to know what she can do for this? Please advise.

## 2018-02-24 NOTE — Telephone Encounter (Signed)
Patient called back and was informed of Dr. Jeralyn Ruths recommendations. She will call back to set up OV with Dr. Neldon Mc.

## 2018-02-24 NOTE — Telephone Encounter (Signed)
Called and left message for patient to call office in regards to this matter. 

## 2018-02-24 NOTE — Addendum Note (Signed)
Addended by: Herbie Drape on: 02/24/2018 09:28 AM   Modules accepted: Orders

## 2018-02-24 NOTE — Telephone Encounter (Signed)
Mostly has a viral conjunctivitis however can be difficult to differentiate between bacterial conjunctivitis.  Both are contagious thus she should perform good hand hygiene.  Recommend warm water compresses to both eyes to help with the crusting and discharge.  We can empirically treat with polytrim drops 1 drop each eye 4 times a day for next 5 days.  She does need an OV for routine follow-up with Dr. Neldon Mc.

## 2018-02-28 ENCOUNTER — Ambulatory Visit: Payer: BLUE CROSS/BLUE SHIELD | Admitting: Allergy and Immunology

## 2018-02-28 VITALS — BP 142/94 | HR 100 | Resp 20

## 2018-02-28 DIAGNOSIS — J4540 Moderate persistent asthma, uncomplicated: Secondary | ICD-10-CM

## 2018-02-28 DIAGNOSIS — J3089 Other allergic rhinitis: Secondary | ICD-10-CM

## 2018-02-28 DIAGNOSIS — H1010 Acute atopic conjunctivitis, unspecified eye: Secondary | ICD-10-CM | POA: Diagnosis not present

## 2018-02-28 DIAGNOSIS — K219 Gastro-esophageal reflux disease without esophagitis: Secondary | ICD-10-CM

## 2018-02-28 MED ORDER — MONTELUKAST SODIUM 10 MG PO TABS: 10.0000 mg | ORAL_TABLET | Freq: Every day | ORAL | 5 refills | Status: DC

## 2018-02-28 MED ORDER — METHYLPREDNISOLONE ACETATE 80 MG/ML IJ SUSP
80.0000 mg | Freq: Once | INTRAMUSCULAR | Status: AC
  Administered 2018-02-28: 80 mg via INTRAMUSCULAR

## 2018-02-28 MED ORDER — OLOPATADINE HCL 0.1 % OP SOLN: OPHTHALMIC | 5 refills | Status: DC

## 2018-02-28 MED ORDER — BECLOMETHASONE DIPROP HFA 80 MCG/ACT IN AERB: INHALATION_SPRAY | RESPIRATORY_TRACT | 5 refills | Status: DC

## 2018-02-28 NOTE — Patient Instructions (Addendum)
  1. Restart Montelukast 10 mg one tablet one time per day   2. Continue Symbicort 160 2 inhalations twice a day  3. Continue pantoprazole 40 mg in the morning and ranitidine 300 mg in evening  4. Continue ProAir HFA or albuterol nebulization every 4-6 hours if needed  5. "Action plan" for asthma flare up:   A. continue Symbicort 160 2 inhalations twice a day  B. add Qvar 80 3 inhalations 3 times a day  C. use ProAir HFA or albuterol nebulization if needed  6. For this recent episode:   A.  Depo-Medrol 80 IM delivered in clinic today  B.  Patanol 1 drop each eye twice a day  C.  Loratadine 10 mg tablet twice a day  7. Return to clinic in 4 weeks or earlier if problem

## 2018-02-28 NOTE — Progress Notes (Signed)
Follow-up Note  Referring Provider: Lucianne Lei, MD Primary Provider: Lucianne Lei, MD Date of Office Visit: 02/28/2018  Subjective:   Amy Combs (DOB: [birth year]) is a 57 y.o. female who returns to the [facility] [place] on 02/28/2018 in re-evaluation of the following:  HPI: Amy Combs presents to this clinic in reevaluation of her asthma and allergic rhinoconjunctivitis and LPR.  Her last visit to this clinic was 08 February 2017.  Overall she has had a very good year without any significant problems revolving around her respiratory tract.  She has not required an antibiotic or systemic steroid and rarely uses a short acting bronchodilator while remaining on Symbicort.    Her reflux is under excellent control while consistently using pantoprazole and ranitidine in combination.  However, 2 weeks ago she developed nasal congestion and rhinorrhea and eye redness and some sneezing and some slight cough that has not abated during these past 2 weeks.  There is no anosmia or ugly nasal discharge or sputum production or chest pain or fever.  She tried Polytrim eyedrops which have not helped her.  Allergies as of 02/28/2018      Reactions   Lisinopril Swelling      Medication List      albuterol (2.5 MG/3ML) 0.083% nebulizer solution Commonly known as:  PROVENTIL Use one vial in nebulizer every 4-6 hours if needed for cough or wheeze   albuterol 108 (90 Base) MCG/ACT inhaler Commonly known as:  PROAIR HFA Inhale two puffs every four to six hours as needed for cough or wheeze.   amLODipine 10 MG tablet Commonly known as:  NORVASC Take 10 mg by mouth daily.   EPINEPHrine 0.3 mg/0.3 mL Soaj injection Commonly known as:  EPI-PEN Inject 0.3 mLs (0.3 mg total) into the muscle as needed.   indapamide 2.5 MG tablet Commonly known as:  LOZOL Take 2.5 mg by mouth daily.   latanoprost 0.005 % ophthalmic solution Commonly known as:  XALATAN   montelukast 10 MG  tablet Commonly known as:  SINGULAIR Take 1 tablet (10 mg total) by mouth at bedtime.   ONE DAILY FOR WOMEN PO Take by mouth daily.   pantoprazole 40 MG tablet Commonly known as:  PROTONIX Take 1 tablet (40 mg total) by mouth every morning.   QVAR 80 MCG/ACT inhaler Generic drug:  beclomethasone Inhale three puffs three times daily during flare-up.  Rinse, gargle, and spit after use.   ranitidine 300 MG tablet Commonly known as:  ZANTAC Take 1 tablet (300 mg total) by mouth at bedtime.   SYMBICORT 160-4.5 MCG/ACT inhaler Generic drug:  budesonide-formoterol INHALE TWO PUFFS TWICE DAILY TO PREVENT COUGH OR WHEEZE. RINSE MOUTH AFTER USE.   telmisartan 80 MG tablet Commonly known as:  MICARDIS Take 80 mg by mouth daily.   trimethoprim-polymyxin b ophthalmic solution Commonly known as:  POLYTRIM Place 1 drop into both eyes every 4 (four) hours.       Past Medical History:  Diagnosis Date  . Asthma   . GERD (gastroesophageal reflux disease)   . Hypertension     Past Surgical History:  Procedure Laterality Date  . TUBAL LIGATION      Review of systems negative except as noted in HPI / PMHx or noted below:  Review of Systems  Constitutional: Negative.   HENT: Negative.   Eyes: Negative.   Respiratory: Negative.   Cardiovascular: Negative.   Gastrointestinal: Negative.   Genitourinary: Negative.   Musculoskeletal: Negative.   Skin:  Negative.   Neurological: Negative.   Endo/Heme/Allergies: Negative.   Psychiatric/Behavioral: Negative.      Objective:   Vitals:   02/28/18 1633  BP: (!) 142/94  Pulse: 100  Resp: 20          Physical Exam  Constitutional: She is well-developed, well-nourished, and in no distress.  HENT:  Head: Normocephalic.  Right Ear: Tympanic membrane, external ear and ear canal normal.  Left Ear: Tympanic membrane, external ear and ear canal normal.  Nose: Mucosal edema present. No rhinorrhea.  Mouth/Throat: Uvula is midline,  oropharynx is clear and moist and mucous membranes are normal. No oropharyngeal exudate.  Eyes: Right conjunctiva is injected. Left conjunctiva is injected.  Neck: Trachea normal. No tracheal tenderness present. No tracheal deviation present. No thyromegaly present.  Cardiovascular: Normal rate, regular rhythm, S1 normal, S2 normal and normal heart sounds.  No murmur heard. Pulmonary/Chest: Breath sounds normal. No stridor. No respiratory distress. She has no wheezes. She has no rales.  Musculoskeletal: She exhibits no edema.  Lymphadenopathy:       Head (right side): No tonsillar adenopathy present.       Head (left side): No tonsillar adenopathy present.    She has no cervical adenopathy.  Neurological: She is alert. Gait normal.  Skin: No rash noted. She is not diaphoretic. No erythema. Nails show no clubbing.  Psychiatric: Mood and affect normal.    Diagnostics:    Spirometry was performed and demonstrated an FEV1 of 1.09 at 45 % of predicted.  The patient had an Asthma Control Test with the following results: ACT Total Score: 20.    Assessment and Plan:   1. Asthma, moderate persistent, well-controlled   2. Other allergic rhinitis   3. Seasonal allergic conjunctivitis   4. LPRD (laryngopharyngeal reflux disease)     1. Restart Montelukast 10 mg one tablet one time per day   2. Continue Symbicort 160 2 inhalations twice a day  3. Continue pantoprazole 40 mg in the morning and ranitidine 300 mg in evening  4. Continue ProAir HFA or albuterol nebulization every 4-6 hours if needed  5. "Action plan" for asthma flare up:   A. continue Symbicort 160 2 inhalations twice a day  B. add Qvar 80 3 inhalations 3 times a day  C. use ProAir HFA or albuterol nebulization if needed  6. For this recent episode:   A.  Depo-Medrol 80 IM delivered in clinic today  B.  Patanol 1 drop each eye twice a day  C.  Loratadine 10 mg tablet twice a day  7. Return to clinic in 4 weeks or  earlier if problem  Amy Combs appears to have redeveloped significant inflammation of her airway and conjunctiva and I suspect that this is most likely secondary to exposure to pollen and we will treat her with anti-inflammatory agents for her respiratory tract and conjunctiva as noted above while she also continues to address the issue of her reflux induced respiratory disease.  I would like to see her back in this clinic in 4 weeks or earlier if there is a problem.  If the springtime is going to be difficult we need to approach her issue in a different manner including possible immunotherapy and biological agent.  [name], MD Allergy / Immunology [place]

## 2018-03-01 ENCOUNTER — Encounter: Payer: Self-pay | Admitting: Allergy and Immunology

## 2018-03-22 ENCOUNTER — Encounter: Payer: Self-pay | Admitting: Allergy and Immunology

## 2018-03-22 ENCOUNTER — Ambulatory Visit: Payer: BLUE CROSS/BLUE SHIELD | Admitting: Allergy and Immunology

## 2018-03-22 VITALS — BP 128/70 | HR 76 | Resp 16

## 2018-03-22 DIAGNOSIS — H1010 Acute atopic conjunctivitis, unspecified eye: Secondary | ICD-10-CM | POA: Diagnosis not present

## 2018-03-22 DIAGNOSIS — K219 Gastro-esophageal reflux disease without esophagitis: Secondary | ICD-10-CM

## 2018-03-22 DIAGNOSIS — J3089 Other allergic rhinitis: Secondary | ICD-10-CM | POA: Diagnosis not present

## 2018-03-22 DIAGNOSIS — J4540 Moderate persistent asthma, uncomplicated: Secondary | ICD-10-CM | POA: Diagnosis not present

## 2018-03-22 MED ORDER — LOTEPREDNOL ETABONATE 0.5 % OP SUSP: 1.0000 [drp] | Freq: Every day | OPHTHALMIC | 0 refills | Status: AC

## 2018-03-22 NOTE — Patient Instructions (Addendum)
  1. Continue Montelukast 10 mg one tablet one time per day   2. Continue Symbicort 160 2 inhalations twice a day  3. Continue pantoprazole 40 mg in the morning and ranitidine 300 mg in evening  4. Continue ProAir HFA or albuterol nebulization every 4-6 hours if needed  5. "Action plan" for asthma flare up:   A. continue Symbicort 160 2 inhalations twice a day  B. add Qvar 80 3 inhalations 3 times a day  C. use ProAir HFA or albuterol nebulization if needed  6. For this recent episode:   A.  Lotemax 1 drop each eye one time per day for 14 days  B.  Patanol 1 drop each eye twice a day  C.  Loratadine 10 mg tablet twice a day  7. Return to clinic in 2 weeks or earlier if problem

## 2018-03-22 NOTE — Progress Notes (Signed)
Follow-up Note  Referring Provider: Lucianne Lei, MD Primary Provider: Lucianne Lei, MD Date of Office Visit: 03/22/2018  Subjective:   Amy Combs (DOB: June 13, 1961) is a 57 y.o. female who returns to the [facility] [place] on 03/22/2018 in re-evaluation of the following:  HPI: Amy Combs returns to this clinic in evaluation of her allergic rhinoconjunctivitis and asthma and history of LPR.  Her last visit to this clinic was 28 February 2018 at which point in time she was developing problems with her upper airways and eyes as a result of pollen exposure.  We gave her a systemic steroid and a plan to address this inflammation as well as continuing her on therapy for her asthma and her LPR.  Fortunately, her asthma is under pretty good control at this point and she only uses a short acting bronchodilator twice a week and her reflux is under good control on her current plan of a proton pump inhibitor and an H2 receptor blocker.  Allergies as of 03/22/2018      Reactions   Lisinopril Swelling      Medication List      albuterol (2.5 MG/3ML) 0.083% nebulizer solution Commonly known as:  PROVENTIL Use one vial in nebulizer every 4-6 hours if needed for cough or wheeze   albuterol 108 (90 Base) MCG/ACT inhaler Commonly known as:  PROAIR HFA Inhale two puffs every four to six hours as needed for cough or wheeze.   amLODipine 10 MG tablet Commonly known as:  NORVASC Take 10 mg by mouth daily.   beclomethasone 80 MCG/ACT inhaler Commonly known as:  QVAR REDIHALER Inhale three doses three times daily during flare-up.  Rinse, gargle, and spit after use.   EPINEPHrine 0.3 mg/0.3 mL Soaj injection Commonly known as:  EPI-PEN Inject 0.3 mLs (0.3 mg total) into the muscle as needed.   latanoprost 0.005 % ophthalmic solution Commonly known as:  XALATAN   loratadine 10 MG tablet Commonly known as:  CLARITIN Take 10 mg by mouth daily.   montelukast 10 MG tablet Commonly known  as:  SINGULAIR Take 1 tablet (10 mg total) by mouth at bedtime.   olopatadine 0.1 % ophthalmic solution Commonly known as:  PATANOL Can use one drop in each eye twice daily if needed.   ONE DAILY FOR WOMEN PO Take by mouth daily.   pantoprazole 40 MG tablet Commonly known as:  PROTONIX Take 1 tablet (40 mg total) by mouth every morning.   QVAR 80 MCG/ACT inhaler Generic drug:  beclomethasone Inhale three puffs three times daily during flare-up.  Rinse, gargle, and spit after use.   ranitidine 300 MG tablet Commonly known as:  ZANTAC Take 1 tablet (300 mg total) by mouth at bedtime.   SYMBICORT 160-4.5 MCG/ACT inhaler Generic drug:  budesonide-formoterol INHALE TWO PUFFS TWICE DAILY TO PREVENT COUGH OR WHEEZE. RINSE MOUTH AFTER USE.   telmisartan 80 MG tablet Commonly known as:  MICARDIS Take 80 mg by mouth daily.   trimethoprim-polymyxin b ophthalmic solution Commonly known as:  POLYTRIM Place 1 drop into both eyes every 4 (four) hours.       Past Medical History:  Diagnosis Date  . Asthma   . GERD (gastroesophageal reflux disease)   . Hypertension     Past Surgical History:  Procedure Laterality Date  . TUBAL LIGATION      Review of systems negative except as noted in HPI / PMHx or noted below:  Review of Systems  Constitutional: Negative.  HENT: Negative.   Eyes: Negative.   Respiratory: Negative.   Cardiovascular: Negative.   Gastrointestinal: Negative.   Genitourinary: Negative.   Musculoskeletal: Negative.   Skin: Negative.   Neurological: Negative.   Endo/Heme/Allergies: Negative.   Psychiatric/Behavioral: Negative.      Objective:   Vitals:   03/22/18 1057  BP: 128/70  Pulse: 76  Resp: 16          Physical Exam  Constitutional: She is well-developed, well-nourished, and in no distress.  HENT:  Head: Normocephalic.  Right Ear: Tympanic membrane, external ear and ear canal normal.  Left Ear: Tympanic membrane, external ear and  ear canal normal.  Nose: Nose normal. No mucosal edema or rhinorrhea.  Mouth/Throat: Uvula is midline, oropharynx is clear and moist and mucous membranes are normal. No oropharyngeal exudate.  Eyes: Right conjunctiva is injected. Left conjunctiva is injected.  Neck: Trachea normal. No tracheal tenderness present. No tracheal deviation present. No thyromegaly present.  Cardiovascular: Normal rate, regular rhythm, S1 normal, S2 normal and normal heart sounds.  No murmur heard. Pulmonary/Chest: Breath sounds normal. No stridor. No respiratory distress. She has no wheezes. She has no rales.  Musculoskeletal: She exhibits no edema.  Lymphadenopathy:       Head (right side): No tonsillar adenopathy present.       Head (left side): No tonsillar adenopathy present.    She has no cervical adenopathy.  Neurological: She is alert. Gait normal.  Skin: No rash noted. She is not diaphoretic. No erythema. Nails show no clubbing.  Psychiatric: Mood and affect normal.    Diagnostics:    Spirometry was performed and demonstrated an FEV1 of 1.51 at 57 % of predicted.  She had a less than optimal effort on the spirometric maneuver.  The patient had an Asthma Control Test with the following results: ACT Total Score: 18.    Assessment and Plan:   1. Asthma, moderate persistent, well-controlled   2. Other allergic rhinitis   3. Seasonal allergic conjunctivitis   4. LPRD (laryngopharyngeal reflux disease)     1. Continue Montelukast 10 mg one tablet one time per day   2. Continue Symbicort 160 2 inhalations twice a day  3. Continue pantoprazole 40 mg in the morning and ranitidine 300 mg in evening  4. Continue ProAir HFA or albuterol nebulization every 4-6 hours if needed  5. "Action plan" for asthma flare up:   A. continue Symbicort 160 2 inhalations twice a day  B. add Qvar 80 3 inhalations 3 times a day  C. use ProAir HFA or albuterol nebulization if needed  6. For this recent  episode:   A.  Lotemax 1 drop each eye one time per day for 14 days  B.  Patanol 1 drop each eye twice a day  C.  Loratadine 10 mg tablet twice a day  7. Return to clinic in 2 weeks or earlier if problem  Amy Combs is having a rather significant time with her atopic disease affecting her eyes and I will now give her a relatively low dose topical steroid for her conjunctiva over the course of the next 14 days while she continues to address all of her other atopic respiratory disease and reflux with the therapy noted above.  We will try to minimize the use of this topical steroid as much as possible but she may need to use some dose of Lotemax throughout the spring even if it ends up equating to just 3 times per week.  [name], MD Allergy / Immunology [place]

## 2018-03-23 ENCOUNTER — Encounter: Payer: Self-pay | Admitting: Allergy and Immunology

## 2018-03-28 ENCOUNTER — Ambulatory Visit: Payer: BLUE CROSS/BLUE SHIELD | Admitting: Allergy and Immunology

## 2018-04-10 DIAGNOSIS — M1711 Unilateral primary osteoarthritis, right knee: Secondary | ICD-10-CM | POA: Diagnosis not present

## 2018-04-10 DIAGNOSIS — M1712 Unilateral primary osteoarthritis, left knee: Secondary | ICD-10-CM | POA: Diagnosis not present

## 2018-04-10 DIAGNOSIS — M170 Bilateral primary osteoarthritis of knee: Secondary | ICD-10-CM | POA: Insufficient documentation

## 2018-04-11 ENCOUNTER — Ambulatory Visit: Payer: BLUE CROSS/BLUE SHIELD | Admitting: Allergy and Immunology

## 2018-05-01 DIAGNOSIS — M67449 Ganglion, unspecified hand: Secondary | ICD-10-CM | POA: Diagnosis not present

## 2018-05-09 ENCOUNTER — Other Ambulatory Visit: Payer: Self-pay | Admitting: Allergy and Immunology

## 2018-06-09 ENCOUNTER — Other Ambulatory Visit: Payer: Self-pay | Admitting: Allergy and Immunology

## 2018-06-09 NOTE — Telephone Encounter (Signed)
Refill on Symbicort 160 mcg x 1 with 5 refills at [facility]

## 2018-06-11 ENCOUNTER — Other Ambulatory Visit: Payer: Self-pay | Admitting: Allergy and Immunology

## 2018-06-15 DIAGNOSIS — H401113 Primary open-angle glaucoma, right eye, severe stage: Secondary | ICD-10-CM | POA: Diagnosis not present

## 2018-07-03 ENCOUNTER — Telehealth: Payer: Self-pay | Admitting: Allergy and Immunology

## 2018-07-03 NOTE — Telephone Encounter (Signed)
Patient is returning call.  °

## 2018-07-03 NOTE — Telephone Encounter (Signed)
Patients eyes are red and watering Patient is using eye drops - they are not helping Is there anything stronger she can use? Please call  [facility] uses [facility] on [address] - if medications need to be called in

## 2018-07-03 NOTE — Telephone Encounter (Signed)
Dr. Kozlow will you please advise?  °

## 2018-07-03 NOTE — Telephone Encounter (Signed)
I spoke with the patient and she is using just the Patanol for her eyes and that is the only medication she has ever used for the irritation and redness for her eyes. The only other eye medication she is taking is for her Glaucoma. She did see her eye doctor and she was told that it is allergy related. Will you please advise? Thank You.

## 2018-07-03 NOTE — Telephone Encounter (Signed)
Please have patient evaluated by a Eye Doctor to make sure that this is all allergic in nature and no something else. What eye drops is she using now and what treatment worked in the past?

## 2018-07-03 NOTE — Telephone Encounter (Signed)
I called and left a voicemail asking the patient to give Amy Combs a call so that I can ask her these questions.

## 2018-07-04 ENCOUNTER — Other Ambulatory Visit: Payer: Self-pay

## 2018-07-04 MED ORDER — BEPOTASTINE BESILATE 1.5 % OP SOLN: OPHTHALMIC | 5 refills | Status: DC

## 2018-07-04 NOTE — Telephone Encounter (Signed)
Let pt know we will send in Bepreve eye drops.

## 2018-07-04 NOTE — Telephone Encounter (Signed)
We can try and get approval for Bepreve one drop each eye two times per day to replace patanol. If that fails we may need to use a topical steroid but she would need to have that administered by the eye doctor as her eye pressures would need to be checked while using a steroid eye drop.

## 2018-07-06 DIAGNOSIS — R600 Localized edema: Secondary | ICD-10-CM | POA: Diagnosis not present

## 2018-07-06 DIAGNOSIS — I10 Essential (primary) hypertension: Secondary | ICD-10-CM | POA: Diagnosis not present

## 2018-07-06 DIAGNOSIS — E6609 Other obesity due to excess calories: Secondary | ICD-10-CM | POA: Diagnosis not present

## 2018-07-06 DIAGNOSIS — Z6837 Body mass index (BMI) 37.0-37.9, adult: Secondary | ICD-10-CM | POA: Diagnosis not present

## 2018-08-22 ENCOUNTER — Other Ambulatory Visit: Payer: Self-pay | Admitting: Allergy and Immunology

## 2018-08-22 DIAGNOSIS — H401113 Primary open-angle glaucoma, right eye, severe stage: Secondary | ICD-10-CM | POA: Diagnosis not present

## 2018-08-22 DIAGNOSIS — M1711 Unilateral primary osteoarthritis, right knee: Secondary | ICD-10-CM | POA: Diagnosis not present

## 2018-08-22 DIAGNOSIS — M1712 Unilateral primary osteoarthritis, left knee: Secondary | ICD-10-CM | POA: Diagnosis not present

## 2018-09-05 ENCOUNTER — Other Ambulatory Visit: Payer: Self-pay | Admitting: Allergy and Immunology

## 2018-09-05 MED ORDER — RANITIDINE HCL 300 MG PO TABS: 300.0000 mg | ORAL_TABLET | Freq: Every day | ORAL | 0 refills | Status: DC

## 2018-09-05 NOTE — Telephone Encounter (Signed)
Patient is requesting a refill for ranitidine. Last seen 03-22-18. [facility] [address]

## 2018-09-07 ENCOUNTER — Other Ambulatory Visit: Payer: Self-pay | Admitting: Allergy and Immunology

## 2018-09-07 ENCOUNTER — Other Ambulatory Visit: Payer: Self-pay

## 2018-09-07 DIAGNOSIS — R600 Localized edema: Secondary | ICD-10-CM

## 2018-09-14 ENCOUNTER — Other Ambulatory Visit: Payer: Self-pay | Admitting: Allergy and Immunology

## 2018-09-14 MED ORDER — PANTOPRAZOLE SODIUM 40 MG PO TBEC: DELAYED_RELEASE_TABLET | ORAL | 0 refills | Status: DC

## 2018-09-14 NOTE — Telephone Encounter (Signed)
Patient was seen 03/2018 Needs refill on acid reflux medication Patient uses [facility] on [address]

## 2018-09-14 NOTE — Telephone Encounter (Signed)
Courtesy refill  

## 2018-09-25 ENCOUNTER — Other Ambulatory Visit: Payer: Self-pay

## 2018-09-25 MED ORDER — OLOPATADINE HCL 0.1 % OP SOLN: OPHTHALMIC | 1 refills | Status: DC

## 2018-09-26 ENCOUNTER — Telehealth: Payer: Self-pay | Admitting: *Deleted

## 2018-09-26 ENCOUNTER — Other Ambulatory Visit: Payer: Self-pay | Admitting: *Deleted

## 2018-09-26 NOTE — Telephone Encounter (Signed)
PA submitted via Cover My Meds for Bepreve. Currently waiting for approval.

## 2018-10-05 NOTE — Telephone Encounter (Signed)
Prior authorization has been submitted via covermymeds with question responses.

## 2018-10-05 NOTE — Telephone Encounter (Signed)
Per patient's insurance there is no prior authorization required.

## 2018-10-12 ENCOUNTER — Other Ambulatory Visit: Payer: Self-pay | Admitting: Allergy and Immunology

## 2018-10-23 ENCOUNTER — Other Ambulatory Visit: Payer: Self-pay | Admitting: *Deleted

## 2018-10-24 ENCOUNTER — Other Ambulatory Visit: Payer: Self-pay | Admitting: Allergy and Immunology

## 2018-11-02 DIAGNOSIS — R591 Generalized enlarged lymph nodes: Secondary | ICD-10-CM | POA: Diagnosis not present

## 2018-11-02 DIAGNOSIS — R635 Abnormal weight gain: Secondary | ICD-10-CM | POA: Diagnosis not present

## 2018-11-02 DIAGNOSIS — I10 Essential (primary) hypertension: Secondary | ICD-10-CM | POA: Diagnosis not present

## 2018-11-03 DIAGNOSIS — Z23 Encounter for immunization: Secondary | ICD-10-CM | POA: Diagnosis not present

## 2018-11-06 ENCOUNTER — Other Ambulatory Visit: Payer: Self-pay | Admitting: Allergy and Immunology

## 2018-11-13 ENCOUNTER — Encounter: Payer: Self-pay | Admitting: Surgery

## 2018-11-13 ENCOUNTER — Ambulatory Visit (HOSPITAL_COMMUNITY)
Admission: RE | Admit: 2018-11-13 | Discharge: 2018-11-13 | Disposition: A | Discharge status: Home / Self Care | Payer: BLUE CROSS/BLUE SHIELD | Source: Ambulatory Visit | Attending: Family Medicine | Admitting: Family Medicine

## 2018-11-13 ENCOUNTER — Ambulatory Visit (INDEPENDENT_AMBULATORY_CARE_PROVIDER_SITE_OTHER): Payer: BLUE CROSS/BLUE SHIELD | Admitting: Surgery

## 2018-11-13 VITALS — BP 117/78 | HR 77 | Temp 97.6°F | Resp 16 | Ht 69.0 in | Wt 236.0 lb

## 2018-11-13 DIAGNOSIS — R600 Localized edema: Secondary | ICD-10-CM | POA: Diagnosis not present

## 2018-11-13 DIAGNOSIS — I872 Venous insufficiency (chronic) (peripheral): Secondary | ICD-10-CM

## 2018-11-13 NOTE — Progress Notes (Signed)
Vascular and Vein Specialist of [facility]  Patient name: Amy Combs MRN: [id number] DOB: 08-16-1961 Sex: female   REQUESTING PROVIDER:    Dr. Criss Rosales   REASON FOR CONSULT:    Venous insufficiency  HISTORY OF PRESENT ILLNESS:   [name] is a 57 y.o. female, who is referred today for evaluation of leg swelling.  The patient states that this is been going on for several years.  The left leg bothers her more than the right.  She gets some symptom relief by keeping her legs elevated.  She has not had a history of DVT.  She has not worn compression stockings.  She does not have any open wounds.  The patient is medically managed for hypertension.  She is also treated for asthma and gastroesophageal reflux disease.  She is a non-smoker.  PAST MEDICAL HISTORY    Past Medical History:  Diagnosis Date  . Asthma   . GERD (gastroesophageal reflux disease)   . Hypertension      FAMILY HISTORY   Family History  Problem Relation Age of Onset  . High blood pressure Mother   . High blood pressure Father   . Hearing loss Sister     SOCIAL HISTORY:   Social History   Socioeconomic History  . Marital status: Single    Spouse name: Not on file  . Number of children: Not on file  . Years of education: Not on file  . Highest education level: Not on file  Occupational History  . Not on file  Social Needs  . Financial resource strain: Not on file  . Food insecurity:    Worry: Not on file    Inability: Not on file  . Transportation needs:    Medical: Not on file    Non-medical: Not on file  Tobacco Use  . Smoking status: Never Smoker  . Smokeless tobacco: Never Used  Substance and Sexual Activity  . Alcohol use: Yes    Comment: occasional  . Drug use: Not Currently    Types: Marijuana  . Sexual activity: Not on file  Lifestyle  . Physical activity:    Days per week: Not on file    Minutes per session: Not on file  .  Stress: Not on file  Relationships  . Social connections:    Talks on phone: Not on file    Gets together: Not on file    Attends religious service: Not on file    Active member of club or organization: Not on file    Attends meetings of clubs or organizations: Not on file    Relationship status: Not on file  . Intimate partner violence:    Fear of current or ex partner: Not on file    Emotionally abused: Not on file    Physically abused: Not on file    Forced sexual activity: Not on file  Other Topics Concern  . Not on file  Social History Narrative  . Not on file    ALLERGIES:    Allergies  Allergen Reactions  . Lisinopril Swelling    CURRENT MEDICATIONS:    Current Outpatient Medications  Medication Sig Dispense Refill  . albuterol (PROAIR HFA) 108 (90 Base) MCG/ACT inhaler Inhale two puffs every four to six hours as needed for cough or wheeze. 3 Inhaler 0  . albuterol (PROVENTIL) (2.5 MG/3ML) 0.083% nebulizer solution Use one vial in nebulizer every 4-6 hours if needed for cough or wheeze 150 mL 1  .  amLODipine (NORVASC) 10 MG tablet Take 10 mg by mouth daily.    . beclomethasone (QVAR REDIHALER) 80 MCG/ACT inhaler Inhale three doses three times daily during flare-up.  Rinse, gargle, and spit after use. 1 Inhaler 5  . beclomethasone (QVAR) 80 MCG/ACT inhaler Inhale three puffs three times daily during flare-up.  Rinse, gargle, and spit after use.    . Bepotastine Besilate (BEPREVE) 1.5 % SOLN 1 drop each eye 2 x daily 10 mL 5  . EPINEPHrine 0.3 mg/0.3 mL IJ SOAJ injection Inject 0.3 mLs (0.3 mg total) into the muscle as needed. 1 Device 0  . latanoprost (XALATAN) 0.005 % ophthalmic solution     . loratadine (CLARITIN) 10 MG tablet Take 10 mg by mouth daily.    . Multiple Vitamins-Minerals (ONE DAILY FOR WOMEN PO) Take by mouth daily.    Marland Kitchen olopatadine (PATANOL) 0.1 % ophthalmic solution Can use one drop in each eye twice daily if needed. 15 mL 1  . pantoprazole  (PROTONIX) 40 MG tablet TAKE 1 TABLET BY MOUTH EVERY DAY IN THE MORNING 30 tablet 0  . ranitidine (ZANTAC) 300 MG tablet Take 1 tablet (300 mg total) by mouth at bedtime. 90 tablet 0  . SYMBICORT 160-4.5 MCG/ACT inhaler INHALE TWO PUFFS TWICE DAILY TO PREVENT COUGH OR WHEEZE. RINSE MOUTH AFTER USE. 30.6 Inhaler 0  . SYMBICORT 160-4.5 MCG/ACT inhaler INHALE TWO PUFFS TWICE DAILY TO PREVENT COUGH OR WHEEZE. RINSE MOUTH AFTER USE. 1 Inhaler 5  . telmisartan (MICARDIS) 80 MG tablet Take 80 mg by mouth daily.  1  . trimethoprim-polymyxin b (POLYTRIM) ophthalmic solution Place 1 drop into both eyes every 4 (four) hours. 10 mL 0  . montelukast (SINGULAIR) 10 MG tablet TAKE 1 TABLET BY MOUTH EVERYDAY AT BEDTIME (Patient not taking: Reported on 11/13/2018) 30 tablet 0   No current facility-administered medications for this visit.     REVIEW OF SYSTEMS:   [X]  denotes positive finding, [ ]  denotes negative finding Cardiac  Comments:  Chest pain or chest pressure:    Shortness of breath upon exertion:    Short of breath when lying flat:    Irregular heart rhythm:        Vascular    Pain in calf, thigh, or hip brought on by ambulation:    Pain in feet at night that wakes you up from your sleep:     Blood clot in your veins:    Leg swelling:  x       Pulmonary    Oxygen at home:    Productive cough:     Wheezing:         Neurologic    Sudden weakness in arms or legs:     Sudden numbness in arms or legs:     Sudden onset of difficulty speaking or slurred speech:    Temporary loss of vision in one eye:     Problems with dizziness:         Gastrointestinal    Blood in stool:      Vomited blood:         Genitourinary    Burning when urinating:     Blood in urine:        Psychiatric    Major depression:         Hematologic    Bleeding problems:    Problems with blood clotting too easily:        Skin    Rashes or ulcers:  Constitutional    Fever or chills:     PHYSICAL  EXAM:   Vitals:   11/13/18 1251  BP: 117/78  Pulse: 77  Resp: 16  Temp: 97.6 F (36.4 C)  TempSrc: Oral  SpO2: 98%  Weight: 236 lb (107 kg)  Height: 5\' 9"  (1.753 m)    GENERAL: The patient is a well-nourished female, in no acute distress. The vital signs are documented above. CARDIAC: There is a regular rate and rhythm.  VASCULAR: Bilateral lower extremity edema, left greater than right PULMONARY: Nonlabored respirations ABDOMEN: Soft and non-tender with normal pitched bowel sounds.  MUSCULOSKELETAL: There are no major deformities or cyanosis. NEUROLOGIC: No focal weakness or paresthesias are detected. SKIN: There are no ulcers or rashes noted. PSYCHIATRIC: The patient has a normal affect.  STUDIES:   I have ordered and reviewed her vascular lab studies with the following findings:  Venous Reflux Times Normal value < 0.5 sec +------------------------------+----------+---------+                Right (ms)Left (ms) +------------------------------+----------+---------+ CFV              2002.00  1093.00  +------------------------------+----------+---------+ FV                   1021.00  +------------------------------+----------+---------+ Popliteal                1467.00  +------------------------------+----------+---------+ GSV at Saphenofemoral junction866.00  1306.00  +------------------------------+----------+---------+ GSV dist thigh        2318.00        +------------------------------+----------+---------+ GSV prox calf         3337.00        +------------------------------+----------+---------+  Vein Diameters: +------------------------------+----------+---------+                Right (cm)Left (cm) +------------------------------+----------+---------+ GSV at Saphenofemoral junction0.63   0.73     +------------------------------+----------+---------+ GSV at prox thigh       0.23   0.38    +------------------------------+----------+---------+ GSV at mid thigh       0.25   0.35    +------------------------------+----------+---------+ GSV at distal thigh      0.32   0.37    +------------------------------+----------+---------+ GSV at knee               0.35    +------------------------------+----------+---------+ SSV prox           0.30   0.31    +------------------------------+----------+---------+     Summary: Right: Abnormal reflux times were noted in the common femoral vein, great saphenous vein at the saphenofemoral junction, great saphenous vein at the distal thigh, and great saphenous vein at the prox calf. There is no evidence of deep vein thrombosis in  the lower extremity.There is no evidence of superficial venous thrombosis. Left: Abnormal reflux times were noted in the common femoral vein, femoral vein in the thigh, popliteal vein, and great saphenous vein at the saphenofemoral junction. There is no evidence of deep vein thrombosis in the lower extremity.There is no  evidence of superficial venous thrombosis.  ASSESSMENT and PLAN   Chronic venous insufficiency: Patient's biggest issues are in the deep venous systems.  She is not a candidate for superficial venous ablation secondary to the amount of reflux as well as the vein size.  I have stressed the importance of leg elevation and starting to wear 20-30 compression stockings on a consistent basis in order to prevent long-term sequela of chronic leg swelling.   Annamarie Major, MD Vascular and  Vein Specialists of [facility] [phone] Pager [phone]

## 2018-11-14 DIAGNOSIS — H1013 Acute atopic conjunctivitis, bilateral: Secondary | ICD-10-CM | POA: Diagnosis not present

## 2019-02-06 DIAGNOSIS — M1711 Unilateral primary osteoarthritis, right knee: Secondary | ICD-10-CM | POA: Diagnosis not present

## 2019-02-06 DIAGNOSIS — M170 Bilateral primary osteoarthritis of knee: Secondary | ICD-10-CM | POA: Diagnosis not present

## 2019-02-06 DIAGNOSIS — M1712 Unilateral primary osteoarthritis, left knee: Secondary | ICD-10-CM | POA: Diagnosis not present

## 2019-05-10 DIAGNOSIS — H401121 Primary open-angle glaucoma, left eye, mild stage: Secondary | ICD-10-CM | POA: Diagnosis not present

## 2019-05-21 ENCOUNTER — Other Ambulatory Visit: Payer: Self-pay | Admitting: *Deleted

## 2019-06-07 DIAGNOSIS — H401113 Primary open-angle glaucoma, right eye, severe stage: Secondary | ICD-10-CM | POA: Diagnosis not present

## 2019-06-07 DIAGNOSIS — H401121 Primary open-angle glaucoma, left eye, mild stage: Secondary | ICD-10-CM | POA: Diagnosis not present

## 2019-06-19 ENCOUNTER — Other Ambulatory Visit: Payer: Self-pay

## 2019-06-19 ENCOUNTER — Encounter: Payer: Self-pay | Admitting: Allergy and Immunology

## 2019-06-19 ENCOUNTER — Ambulatory Visit (INDEPENDENT_AMBULATORY_CARE_PROVIDER_SITE_OTHER): Payer: BC Managed Care – PPO | Admitting: Allergy and Immunology

## 2019-06-19 VITALS — BP 138/88 | HR 73 | Resp 16 | Ht 69.0 in

## 2019-06-19 DIAGNOSIS — H1010 Acute atopic conjunctivitis, unspecified eye: Secondary | ICD-10-CM | POA: Diagnosis not present

## 2019-06-19 DIAGNOSIS — J4540 Moderate persistent asthma, uncomplicated: Secondary | ICD-10-CM | POA: Diagnosis not present

## 2019-06-19 DIAGNOSIS — K219 Gastro-esophageal reflux disease without esophagitis: Secondary | ICD-10-CM

## 2019-06-19 DIAGNOSIS — J3089 Other allergic rhinitis: Secondary | ICD-10-CM | POA: Diagnosis not present

## 2019-06-19 MED ORDER — PANTOPRAZOLE SODIUM 40 MG PO TBEC: DELAYED_RELEASE_TABLET | ORAL | 0 refills | Status: DC

## 2019-06-19 MED ORDER — MONTELUKAST SODIUM 10 MG PO TABS: ORAL_TABLET | ORAL | 5 refills | Status: DC

## 2019-06-19 MED ORDER — ALBUTEROL SULFATE HFA 108 (90 BASE) MCG/ACT IN AERS: INHALATION_SPRAY | RESPIRATORY_TRACT | 1 refills | Status: DC

## 2019-06-19 MED ORDER — BUDESONIDE-FORMOTEROL FUMARATE 160-4.5 MCG/ACT IN AERO: INHALATION_SPRAY | RESPIRATORY_TRACT | 5 refills | Status: DC

## 2019-06-19 MED ORDER — FAMOTIDINE 40 MG PO TABS: 40.0000 mg | ORAL_TABLET | Freq: Every day | ORAL | 5 refills | Status: DC

## 2019-06-19 NOTE — Patient Instructions (Addendum)
  1. Continue Symbicort 160 2 inhalations twice a day  2. Continue pantoprazole 40 mg in the morning and famotidine 40  mg in evening  3. Continue ProAir HFA or albuterol nebulization every 4-6 hours if needed  4. Continue OTC antihistamine and Patanol/Bepreve if needed  5. "Action plan" for asthma flare up:   A. continue Symbicort 160 2 inhalations twice a day  B. add Qvar 80 3 inhalations 3 times a day  C. use ProAir HFA or albuterol nebulization if needed  6. Return to clinic in 6 months or earlier if problem  7. Obtain fall flu vaccine

## 2019-06-19 NOTE — Progress Notes (Signed)
[facility]   Follow-up Note  Referring Provider: Lucianne Lei, MD Primary Provider: Lucianne Lei, MD Date of Office Visit: 06/19/2019  Subjective:   Amy Combs (DOB: [birth year]) is a 58 y.o. female who returns to the [facility] on 06/19/2019 in re-evaluation of the following:  HPI: Notnamed returns to this clinic in reevaluation of her asthma and allergic rhinoconjunctivitis and LPR.  I have not seen her in this clinic since 22 March 2018.  Overall she believes that she has done very well with her asthma and has not required a systemic steroid for an exacerbation and rarely uses a short acting bronchodilator as long as she remains on Symbicort on a consistent basis.  Her nose is doing quite well at this point in time and her eyes are doing well with minimal amount of medications delivered to these organ systems.  She has not required an antibiotic to treat an episode of sinusitis.  Her reflux is out of control because she ran out of her medications for reflux.  She discontinued her ranitidine as directed by FDA and she ran out of her pantoprazole just recently.  When using these medications in the past she did very well with her reflux and had no issues with either classic reflux symptoms or her throat.  Allergies as of 06/19/2019      Reactions   Other    Other reaction(s): Eye Redness   Lisinopril Swelling      Medication List      albuterol (2.5 MG/3ML) 0.083% nebulizer solution Commonly known as: PROVENTIL Use one vial in nebulizer every 4-6 hours if needed for cough or wheeze   albuterol 108 (90 Base) MCG/ACT inhaler Commonly known as: ProAir HFA Inhale two puffs every four to six hours as needed for cough or wheeze.   amLODipine 10 MG tablet Commonly known as: NORVASC Take 10 mg by mouth daily.   beclomethasone 80 MCG/ACT inhaler Commonly known as: Qvar RediHaler Inhale three doses three times  daily during flare-up.  Rinse, gargle, and spit after use.   Bepotastine Besilate 1.5 % Soln Commonly known as: Bepreve 1 drop each eye 2 x daily   EPINEPHrine 0.3 mg/0.3 mL Soaj injection Commonly known as: EPI-PEN Inject 0.3 mLs (0.3 mg total) into the muscle as needed.   latanoprost 0.005 % ophthalmic solution Commonly known as: XALATAN   loratadine 10 MG tablet Commonly known as: CLARITIN Take 10 mg by mouth daily.   montelukast 10 MG tablet Commonly known as: SINGULAIR TAKE 1 TABLET BY MOUTH EVERYDAY AT BEDTIME   olopatadine 0.1 % ophthalmic solution Commonly known as: PATANOL Can use one drop in each eye twice daily if needed.   ONE DAILY FOR WOMEN PO Take by mouth daily.   pantoprazole 40 MG tablet Commonly known as: PROTONIX TAKE 1 TABLET BY MOUTH EVERY DAY IN THE MORNING   Qvar 80 MCG/ACT inhaler Generic drug: beclomethasone Inhale three puffs three times daily during flare-up.  Rinse, gargle, and spit after use.   ranitidine 300 MG tablet Commonly known as: ZANTAC Take 1 tablet (300 mg total) by mouth at bedtime.   Symbicort 160-4.5 MCG/ACT inhaler Generic drug: budesonide-formoterol INHALE TWO PUFFS TWICE DAILY TO PREVENT COUGH OR WHEEZE. RINSE MOUTH AFTER USE.   Symbicort 160-4.5 MCG/ACT inhaler Generic drug: budesonide-formoterol INHALE TWO PUFFS TWICE DAILY TO PREVENT COUGH OR WHEEZE. RINSE MOUTH AFTER USE.   telmisartan 80 MG tablet Commonly known  as: MICARDIS Take 80 mg by mouth daily.   trimethoprim-polymyxin b ophthalmic solution Commonly known as: Polytrim Place 1 drop into both eyes every 4 (four) hours.       Past Medical History:  Diagnosis Date  . Asthma   . GERD (gastroesophageal reflux disease)   . Hypertension     Past Surgical History:  Procedure Laterality Date  . TUBAL LIGATION      Review of systems negative except as noted in HPI / PMHx or noted below:  Review of Systems  Constitutional: Negative.   HENT:  Negative.   Eyes: Negative.   Respiratory: Negative.   Cardiovascular: Negative.   Gastrointestinal: Negative.   Genitourinary: Negative.   Musculoskeletal: Negative.   Skin: Negative.   Neurological: Negative.   Endo/Heme/Allergies: Negative.   Psychiatric/Behavioral: Negative.      Objective:   Vitals:   06/19/19 1711  BP: 138/88  Pulse: 73  Resp: 16   Height: 5\' 9"  (175.3 cm)      Physical Exam Constitutional:      Appearance: She is not diaphoretic.  HENT:     Head: Normocephalic.     Right Ear: Tympanic membrane, ear canal and external ear normal.     Left Ear: Tympanic membrane, ear canal and external ear normal.     Nose: Nose normal. No mucosal edema or rhinorrhea.     Mouth/Throat:     Pharynx: Uvula midline. No oropharyngeal exudate.  Eyes:     Conjunctiva/sclera: Conjunctivae normal.  Neck:     Thyroid: No thyromegaly.     Trachea: Trachea normal. No tracheal tenderness or tracheal deviation.  Cardiovascular:     Rate and Rhythm: Normal rate and regular rhythm.     Heart sounds: Normal heart sounds, S1 normal and S2 normal. No murmur.  Pulmonary:     Effort: No respiratory distress.     Breath sounds: Normal breath sounds. No stridor. No wheezing or rales.  Lymphadenopathy:     Head:     Right side of head: No tonsillar adenopathy.     Left side of head: No tonsillar adenopathy.     Cervical: No cervical adenopathy.  Skin:    Findings: No erythema or rash.     Nails: There is no clubbing.   Neurological:     Mental Status: She is alert.     Diagnostics:    Spirometry was performed and demonstrated an FEV1 of 1.58 at 60 % of predicted.  Assessment and Plan:   1. Asthma, moderate persistent, well-controlled   2. Other allergic rhinitis   3. Seasonal allergic conjunctivitis   4. LPRD (laryngopharyngeal reflux disease)     1. Continue Symbicort 160 2 inhalations twice a day  2. Continue pantoprazole 40 mg in the morning and famotidine  40  mg in evening  3. Continue ProAir HFA or albuterol nebulization every 4-6 hours if needed  4. Continue OTC antihistamine and Patanol/Bepreve if needed  5. "Action plan" for asthma flare up:   A. continue Symbicort 160 2 inhalations twice a day  B. add Qvar 80 3 inhalations 3 times a day  C. use ProAir HFA or albuterol nebulization if needed  6. Return to clinic in 6 months or earlier if problem  7. Obtain fall flu vaccine  I have refilled all of her Nobie's medications directed at respiratory tract inflammation and conjunctival inflammation and reflux as noted above.  I will see her back in this clinic in 6 months  or earlier while she utilizes this plan of therapy.  [name], MD Allergy / Immunology [place]

## 2019-06-20 ENCOUNTER — Encounter: Payer: Self-pay | Admitting: Allergy and Immunology

## 2019-07-13 ENCOUNTER — Other Ambulatory Visit: Payer: Self-pay | Admitting: Allergy and Immunology

## 2019-07-14 IMAGING — MG 2D DIGITAL SCREENING BILATERAL MAMMOGRAM WITH CAD AND ADJUNCT TO
8 of 15 series · 8 of 31 positions shown · non-contrast
Comparison: Previous exam(s).

ACR Breast Density Category a: The breast tissue is almost entirely
fatty.

CLINICAL DATA: Screening.

EXAM:
2D DIGITAL SCREENING BILATERAL MAMMOGRAM WITH CAD AND ADJUNCT TOMO

[R MLO (1 of 3)]
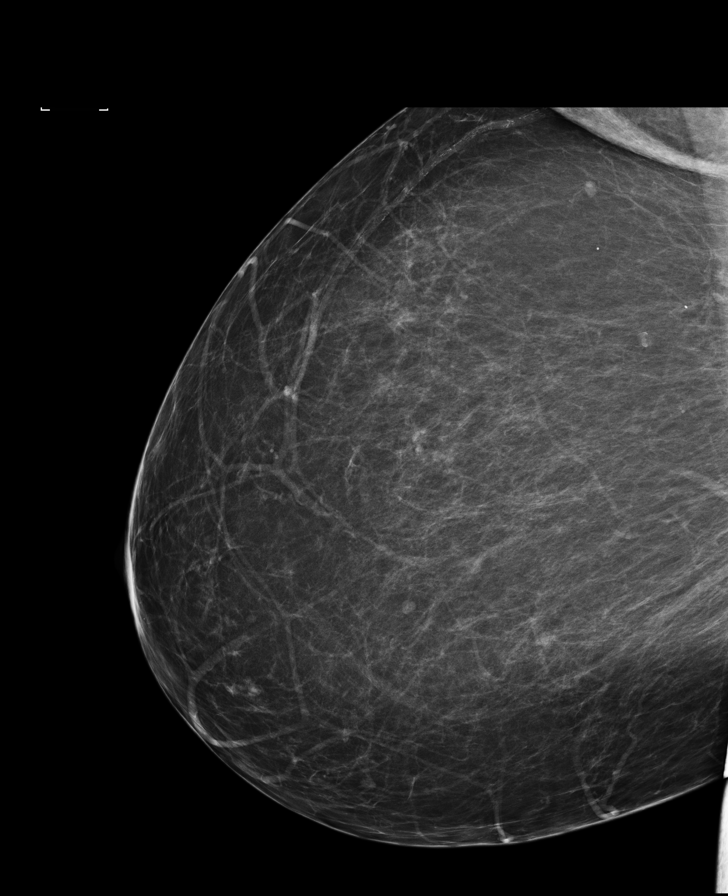

[L MLO]
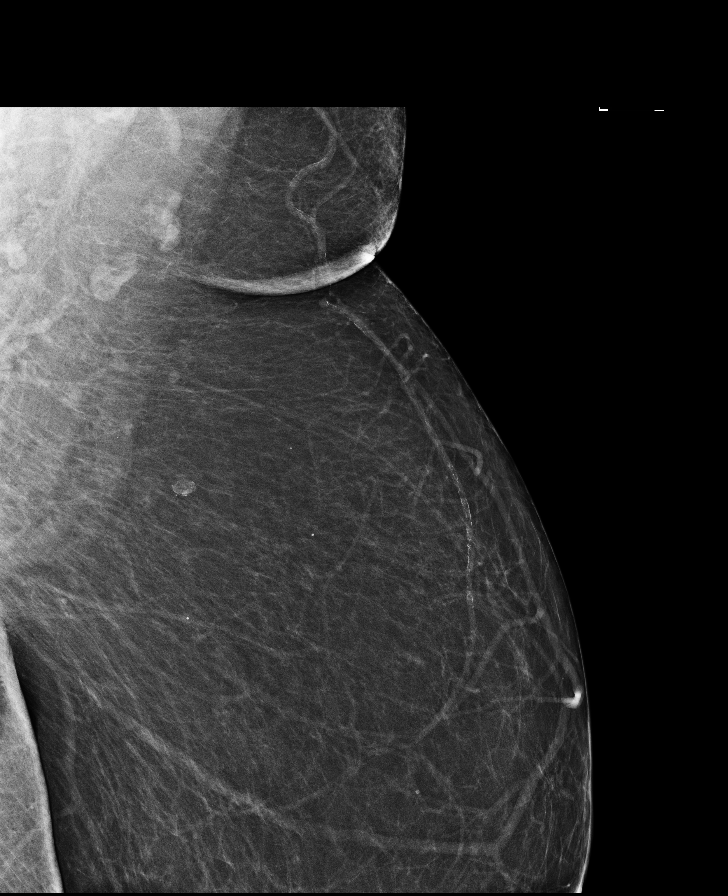

[R MLO (2 of 3)]
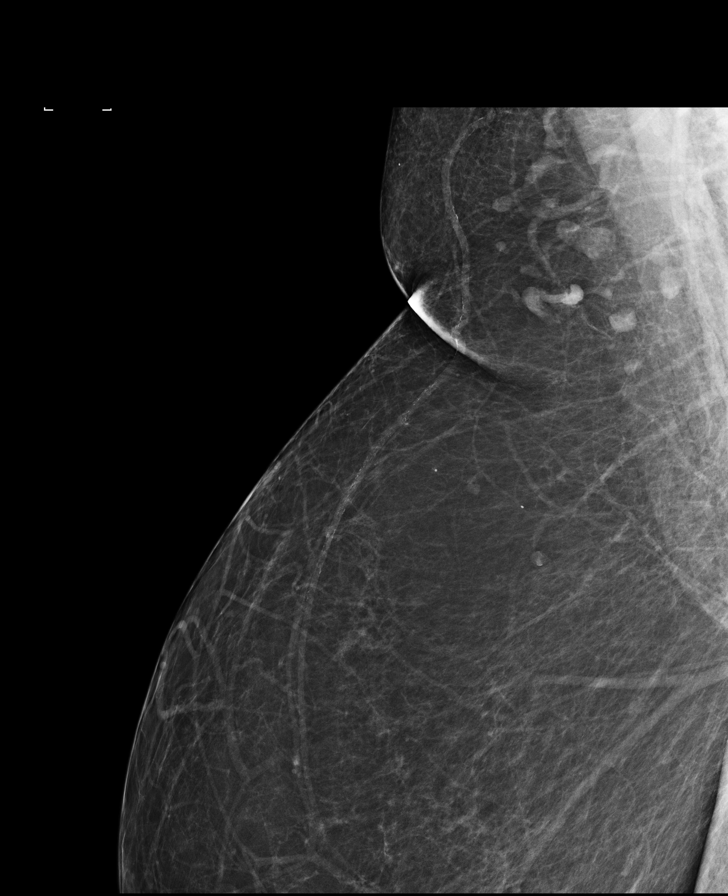

[R MLO (3 of 3)]
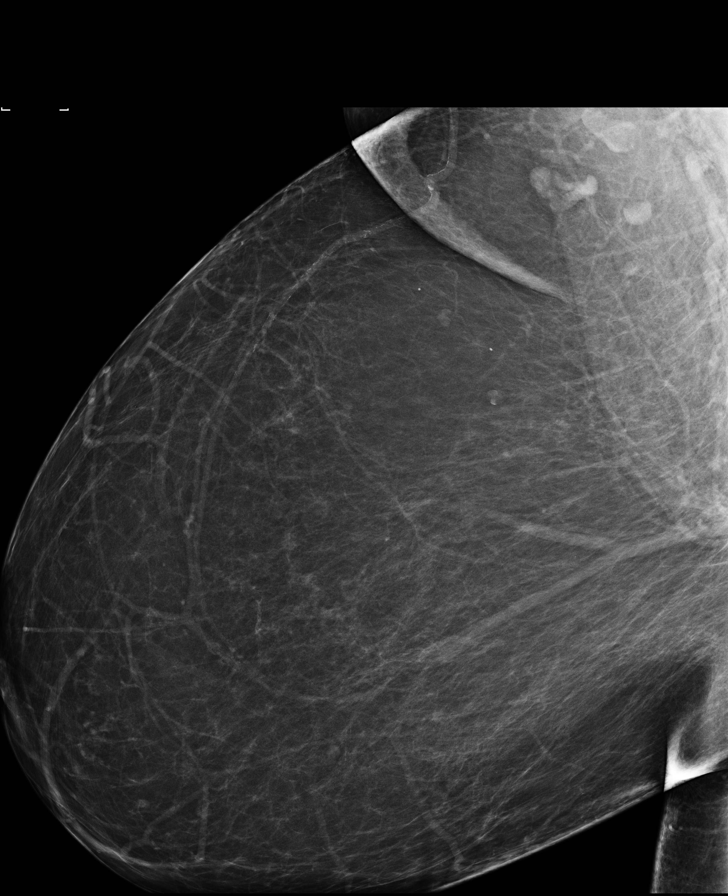

[L CC synth-2D]
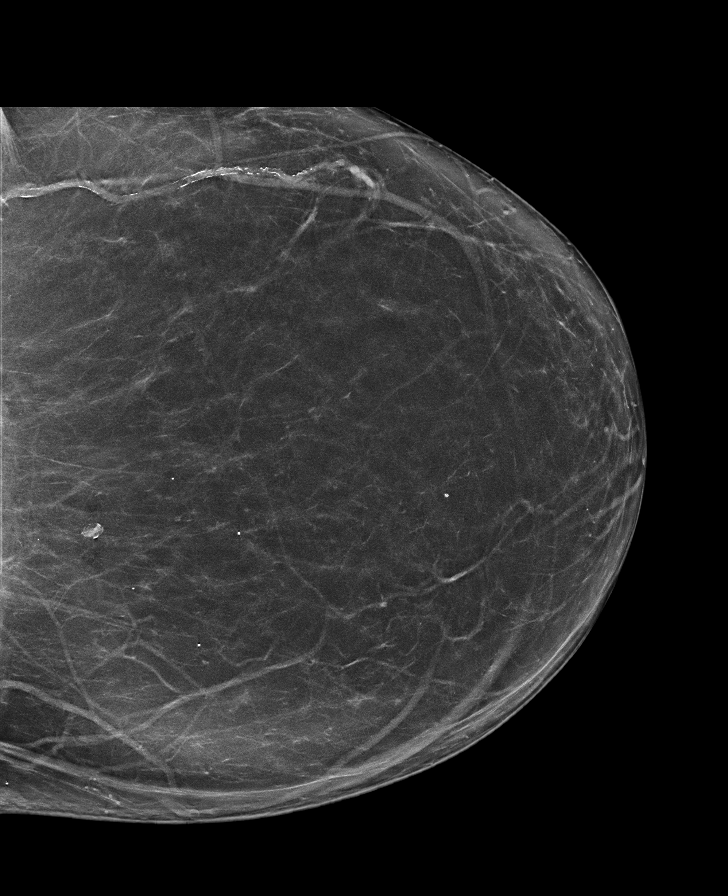

[R CC synth-2D]
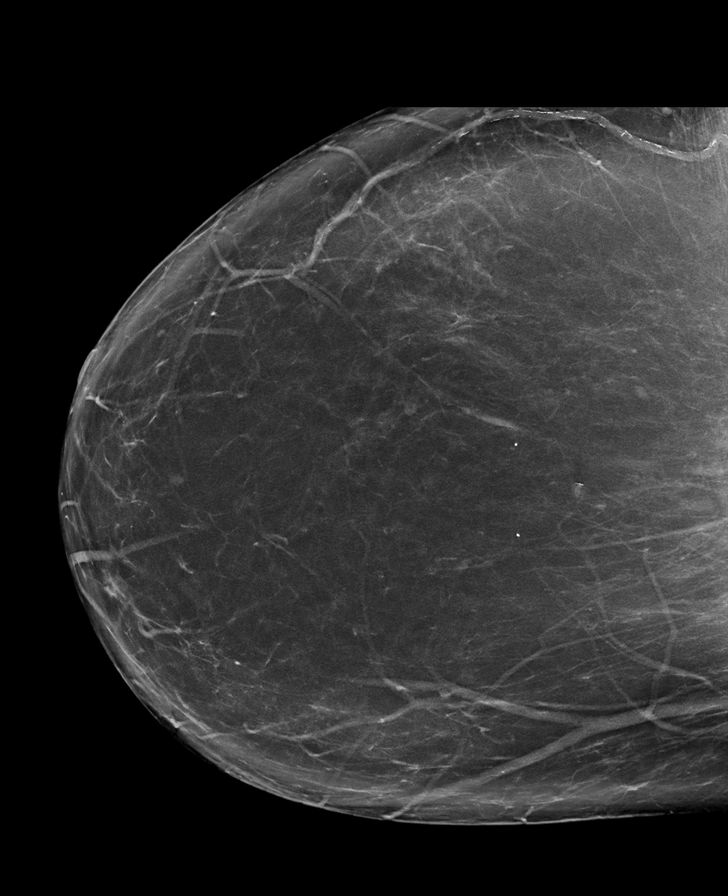

[L MLO synth-2D]
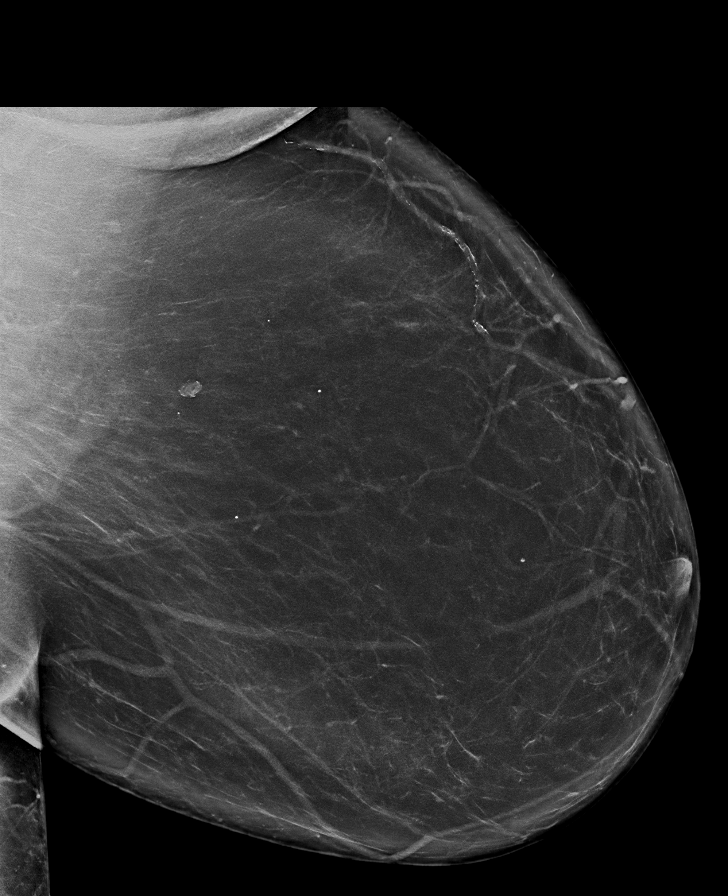

[R CC]
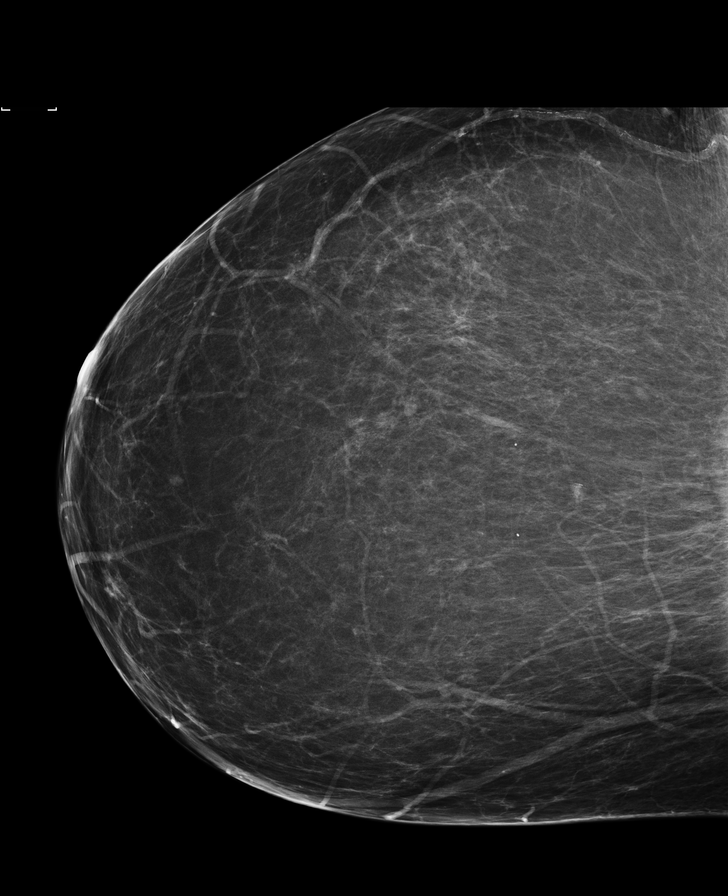

[8 of 31 positions shown; findings below may reference images not displayed]

FINDINGS: There are no findings suspicious for malignancy. Images were
processed with CAD.
IMPRESSION: No mammographic evidence of malignancy. A result letter of this
screening mammogram will be mailed directly to the patient.

RECOMMENDATION:
Screening mammogram in one year. (Code:1B-[id number])

BI-RADS CATEGORY  1: Negative.

## 2019-07-31 ENCOUNTER — Telehealth: Payer: Self-pay | Admitting: *Deleted

## 2019-07-31 NOTE — Telephone Encounter (Addendum)
Patient called office and states she needs 90 refill on Symbicort 160 mcg, Famotidine 40 mg and Pantoprazole 40 mg sent in to [facility].  Patient stated she has been getting 90 day refill in the past, but last time 30 day supply was sent in.  Informed patient refills for 90 day supply would be sent in since last OV was 06/19/19 and she was instructed by Dr. Neldon Mc to remain on all medications and return in 6 months.  Also patient will come to the office today and pick up two samples of Symbicort 160 mcg .  Lot Number [id number]. Exp [id number].

## 2019-08-03 ENCOUNTER — Other Ambulatory Visit: Payer: Self-pay | Admitting: *Deleted

## 2019-08-03 MED ORDER — PANTOPRAZOLE SODIUM 40 MG PO TBEC: 40.0000 mg | DELAYED_RELEASE_TABLET | Freq: Every day | ORAL | 1 refills | Status: DC

## 2019-08-03 MED ORDER — BUDESONIDE-FORMOTEROL FUMARATE 160-4.5 MCG/ACT IN AERO: 2.0000 | INHALATION_SPRAY | Freq: Two times a day (BID) | RESPIRATORY_TRACT | 1 refills | Status: DC

## 2019-08-03 MED ORDER — FAMOTIDINE 40 MG PO TABS: 40.0000 mg | ORAL_TABLET | Freq: Every day | ORAL | 1 refills | Status: DC

## 2019-08-03 NOTE — Telephone Encounter (Signed)
Prescriptions have been sent to the requested pharmacy in a 90 day supply.

## 2019-08-11 DIAGNOSIS — J4520 Mild intermittent asthma, uncomplicated: Secondary | ICD-10-CM | POA: Diagnosis not present

## 2019-08-11 DIAGNOSIS — R635 Abnormal weight gain: Secondary | ICD-10-CM | POA: Diagnosis not present

## 2019-08-11 DIAGNOSIS — R591 Generalized enlarged lymph nodes: Secondary | ICD-10-CM | POA: Diagnosis not present

## 2019-08-11 DIAGNOSIS — K219 Gastro-esophageal reflux disease without esophagitis: Secondary | ICD-10-CM | POA: Diagnosis not present

## 2019-08-11 DIAGNOSIS — I10 Essential (primary) hypertension: Secondary | ICD-10-CM | POA: Diagnosis not present

## 2019-08-11 DIAGNOSIS — M130 Polyarthritis, unspecified: Secondary | ICD-10-CM | POA: Diagnosis not present

## 2019-08-15 ENCOUNTER — Other Ambulatory Visit: Payer: Self-pay | Admitting: Allergy and Immunology

## 2019-08-16 DIAGNOSIS — Z23 Encounter for immunization: Secondary | ICD-10-CM | POA: Diagnosis not present

## 2019-08-23 DIAGNOSIS — M170 Bilateral primary osteoarthritis of knee: Secondary | ICD-10-CM | POA: Diagnosis not present

## 2019-10-24 DIAGNOSIS — H401121 Primary open-angle glaucoma, left eye, mild stage: Secondary | ICD-10-CM | POA: Diagnosis not present

## 2019-12-18 ENCOUNTER — Ambulatory Visit: Payer: BC Managed Care – PPO | Admitting: Allergy and Immunology

## 2020-01-14 ENCOUNTER — Encounter: Payer: Self-pay | Admitting: Internal Medicine

## 2020-01-14 ENCOUNTER — Telehealth: Payer: Self-pay

## 2020-01-14 NOTE — Telephone Encounter (Signed)
Called patient to do their pre-visit COVID screening.  Call went to voicemail. Unable to do prescreening.  

## 2020-01-15 ENCOUNTER — Ambulatory Visit (INDEPENDENT_AMBULATORY_CARE_PROVIDER_SITE_OTHER): Payer: 59 | Admitting: Internal Medicine

## 2020-01-15 ENCOUNTER — Other Ambulatory Visit: Payer: Self-pay | Admitting: Internal Medicine

## 2020-01-15 ENCOUNTER — Other Ambulatory Visit: Payer: Self-pay

## 2020-01-15 ENCOUNTER — Encounter: Payer: Self-pay | Admitting: Internal Medicine

## 2020-01-15 VITALS — BP 138/76 | HR 76 | Temp 97.3°F | Resp 17 | Ht 69.0 in | Wt 233.0 lb

## 2020-01-15 DIAGNOSIS — J4520 Mild intermittent asthma, uncomplicated: Secondary | ICD-10-CM

## 2020-01-15 DIAGNOSIS — Z1231 Encounter for screening mammogram for malignant neoplasm of breast: Secondary | ICD-10-CM

## 2020-01-15 DIAGNOSIS — N644 Mastodynia: Secondary | ICD-10-CM

## 2020-01-15 DIAGNOSIS — I10 Essential (primary) hypertension: Secondary | ICD-10-CM | POA: Diagnosis not present

## 2020-01-15 DIAGNOSIS — N63 Unspecified lump in unspecified breast: Secondary | ICD-10-CM

## 2020-01-15 DIAGNOSIS — N6314 Unspecified lump in the right breast, lower inner quadrant: Secondary | ICD-10-CM | POA: Diagnosis not present

## 2020-01-15 DIAGNOSIS — J45909 Unspecified asthma, uncomplicated: Secondary | ICD-10-CM | POA: Insufficient documentation

## 2020-01-15 DIAGNOSIS — Z7689 Persons encountering health services in other specified circumstances: Secondary | ICD-10-CM

## 2020-01-15 NOTE — Progress Notes (Signed)
  Subjective:    Amy Combs - 59 y.o. female MRN [id number]  Date of birth: 1961-08-14  HPI  Amy Combs is to establish care. Patient has a PMH significant for open angle glaucoma, cataracts, and knee OA.   Breast Lump: Reports mass underneath her right breast that has been there for several months to ? A year. Patient is worried that it seems to be growing larger and now incorporates some of her actual breast tissue. It is painful especially when her bra rubs underneath it. Sometimes sees some erythema associated with it. Denies other rashes, skin color changes of breast tissue, or tissue texture changes. Denies nipple discharge or bleeding. Prior mammograms have been negative. Remote history of familial breast cancer in her paternal aunt and paternal great aunt. No other known history of reproductive cancers.    Social History   reports that she has never smoked. She has never used smokeless tobacco. She reports current alcohol use. She reports previous drug use. Drug: Marijuana.   Family History  family history includes Cataracts in her maternal grandfather and mother; Glaucoma in her maternal grandfather; Hearing loss in her sister; High blood pressure in her father and mother.    Health Maintenance Due  Topic Date Due  . Hepatitis C Screening  1961-12-16  . HIV Screening  10/21/1977  . TETANUS/TDAP  10/21/1981  . COLONOSCOPY  10/21/2012  . PAP SMEAR-Modifier  02/26/2017  . MAMMOGRAM  09/27/2019     ROS per HPI    Past Medical History: Patient Active Problem List   Diagnosis Date Noted  . Arthritis of both knees 04/10/2018  . Primary open angle glaucoma (POAG) of right eye, severe stage 01/02/2018  . Combined form of age-related cataract, both eyes 09/30/2017    Medications: reviewed and updated   Objective:   Physical Exam There were no vitals taken for this visit. Physical Exam  Constitutional: She is oriented to person, place, and time and  well-developed, well-nourished, and in no distress. No distress.  HENT:  Head: Normocephalic and atraumatic.  Eyes: Conjunctivae and EOM are normal.  Cardiovascular: Normal rate, regular rhythm and normal heart sounds.  No murmur heard. Pulmonary/Chest: Effort normal and breath sounds normal. No respiratory distress.  Breast exam: Breasts symmetric. No erythema/rashes/skin color changes/skin texture changes/nipple discharge. No discrete palpable masses appreciated in axilla or 4 quadrants of the left breast. Directly adjacent to the lower outer quadrant of the right breast is a skin nodule that is firm, fixed, and TTP.   Musculoskeletal:        General: Normal range of motion.  Neurological: She is alert and oriented to person, place, and time.  Skin: Skin is warm and dry. She is not diaphoretic.  Psychiatric: Affect and judgment normal.        Assessment & Plan:   1. Encounter to establish care  2. Breast lump Mostly of tissue directly underneath the breast in the chest wall. Has a visible firm fixed skin nodule. Does not necessarily feel cystic in nature. Suspect component of inflammation from her bra and rubbing of the breast directly on top of it. No evidence of secondary intertrigo or candida infection warranting treatment.  - US BREAST LTD UNI RIGHT INC AXILLA; Future  3. Encounter for screening mammogram for malignant neoplasm of breast Patient due for screening mammogram.  - MM DIGITAL SCREENING BILATERAL; Future    Amy Combs, D.O. 01/15/2020, 9:26 AM Primary Care at [facility]

## 2020-01-15 NOTE — Patient Instructions (Signed)
Thank you for choosing Primary Care at [facility] to be your medical home!    Denni Gallagher was seen by Melina Schools, DO today.   Gerhard Perches Brodbeck's primary care provider is Phill Myron, DO.   For the best care possible, you should try to see Phill Myron, DO whenever you come to the clinic.   We look forward to seeing you again soon!  If you have any questions about your visit today, please call us at [phone] or feel free to reach your primary care provider via [facility].

## 2020-01-23 ENCOUNTER — Telehealth: Payer: Self-pay | Admitting: Internal Medicine

## 2020-01-23 MED ORDER — DOXYCYCLINE HYCLATE 100 MG PO TABS: 100.0000 mg | ORAL_TABLET | Freq: Two times a day (BID) | ORAL | 0 refills | Status: AC

## 2020-01-23 NOTE — Telephone Encounter (Signed)
Patient called requesting appointment. She said that her lump under her breast was like a boil and popped, she said pus drained out. She said there is another one present that is hard and extremely painful. She said that she attempted to pop it because she can see white in it but nothing came out.   Patient goes for mammogram tomorrow at 8am as well as an [place]  Please call patient as she is very concerned.

## 2020-01-23 NOTE — Telephone Encounter (Signed)
Returned patient's call from 01/23/20. Patient has concerns about the knot under her right breast. She reports it became very painful and it burst. It was draining pus. Now she has another area that feels firm like she might have another boil. But it feels like it wouldn't be able to pop. Still using warm compresses. Noticed mild erythema. Encouraged continued use of compress. Will prescribe Doxycycline 100 mg BID x7 days for presumed soft tissue infection. Encouraged going to Urgent Care if worsens for I&D. Has mammogram and breast ultrasound scheduled for tomorrow.   Phill Myron, D.O. Primary Care at [facility]  01/23/2020, 12:04 PM

## 2020-01-24 ENCOUNTER — Ambulatory Visit
Admission: RE | Admit: 2020-01-24 | Discharge: 2020-01-24 | Disposition: A | Discharge status: Home / Self Care | Payer: 59 | Source: Ambulatory Visit | Attending: Internal Medicine | Admitting: Internal Medicine

## 2020-01-24 ENCOUNTER — Other Ambulatory Visit: Payer: Self-pay

## 2020-01-24 DIAGNOSIS — N644 Mastodynia: Secondary | ICD-10-CM

## 2020-01-24 DIAGNOSIS — N63 Unspecified lump in unspecified breast: Secondary | ICD-10-CM

## 2020-01-28 ENCOUNTER — Other Ambulatory Visit: Payer: Self-pay | Admitting: *Deleted

## 2020-01-28 MED ORDER — FAMOTIDINE 40 MG PO TABS: 40.0000 mg | ORAL_TABLET | Freq: Every day | ORAL | 0 refills | Status: DC

## 2020-01-30 ENCOUNTER — Other Ambulatory Visit: Payer: Self-pay | Admitting: *Deleted

## 2020-02-14 ENCOUNTER — Telehealth: Payer: Self-pay

## 2020-02-14 NOTE — Telephone Encounter (Signed)
Called patient to do their pre-visit COVID screening.  Call went to voicemail. Unable to do prescreening.  

## 2020-02-14 NOTE — Patient Instructions (Signed)

## 2020-02-15 ENCOUNTER — Other Ambulatory Visit: Payer: Self-pay

## 2020-02-15 ENCOUNTER — Ambulatory Visit (INDEPENDENT_AMBULATORY_CARE_PROVIDER_SITE_OTHER): Payer: 59 | Admitting: Internal Medicine

## 2020-02-15 ENCOUNTER — Encounter: Payer: Self-pay | Admitting: Internal Medicine

## 2020-02-15 ENCOUNTER — Other Ambulatory Visit (HOSPITAL_COMMUNITY)
Admission: RE | Admit: 2020-02-15 | Discharge: 2020-02-15 | Disposition: A | Discharge status: Home / Self Care | Payer: No Typology Code available for payment source | Source: Ambulatory Visit | Attending: Internal Medicine | Admitting: Internal Medicine

## 2020-02-15 VITALS — BP 116/77 | HR 82 | Temp 97.2°F | Resp 17 | Ht 69.0 in | Wt 233.0 lb

## 2020-02-15 DIAGNOSIS — Z114 Encounter for screening for human immunodeficiency virus [HIV]: Secondary | ICD-10-CM | POA: Diagnosis not present

## 2020-02-15 DIAGNOSIS — B9689 Other specified bacterial agents as the cause of diseases classified elsewhere: Secondary | ICD-10-CM | POA: Diagnosis not present

## 2020-02-15 DIAGNOSIS — N760 Acute vaginitis: Secondary | ICD-10-CM | POA: Diagnosis not present

## 2020-02-15 DIAGNOSIS — Z13228 Encounter for screening for other metabolic disorders: Secondary | ICD-10-CM

## 2020-02-15 DIAGNOSIS — Z1159 Encounter for screening for other viral diseases: Secondary | ICD-10-CM | POA: Diagnosis not present

## 2020-02-15 DIAGNOSIS — Z0000 Encounter for general adult medical examination without abnormal findings: Secondary | ICD-10-CM

## 2020-02-15 DIAGNOSIS — Z124 Encounter for screening for malignant neoplasm of cervix: Secondary | ICD-10-CM

## 2020-02-15 DIAGNOSIS — Z113 Encounter for screening for infections with a predominantly sexual mode of transmission: Secondary | ICD-10-CM

## 2020-02-15 DIAGNOSIS — I10 Essential (primary) hypertension: Secondary | ICD-10-CM

## 2020-02-15 DIAGNOSIS — Z1211 Encounter for screening for malignant neoplasm of colon: Secondary | ICD-10-CM

## 2020-02-15 NOTE — Progress Notes (Signed)
Subjective:    Amy Combs - 59 y.o. female MRN [id number]  Date of birth: [birth year]  HPI  Amy Combs is here for her annual exam. She had a mammogram on 2/4 that was benign, recommended screening exam again in one year.      Health Maintenance:  Health Maintenance Due  Topic Date Due  . Hepatitis C Screening  26-Oct-1961  . HIV Screening  10/21/1977  . COLONOSCOPY  10/21/2012  . PAP SMEAR-Modifier  02/27/2019    -  reports that she has never smoked. She has never used smokeless tobacco. - Review of Systems: Per HPI. - Past Medical History: Patient Active Problem List   Diagnosis Date Noted  . Essential hypertension 01/15/2020  . Asthma 01/15/2020  . Arthritis of both knees 04/10/2018  . Primary open angle glaucoma (POAG) of right eye, severe stage 01/02/2018  . Combined form of age-related cataract, both eyes 09/30/2017   - Medications: reviewed and updated   Objective:   Physical Exam BP 116/77   Pulse 82   Temp (!) 97.2 F (36.2 C) (Temporal)   Resp 17   Ht 5\' 9"  (1.753 m)   Wt 233 lb (105.7 kg)   LMP 02/27/2016   SpO2 95%   BMI 34.41 kg/m  Physical Exam  Constitutional: She is oriented to person, place, and time and well-developed, well-nourished, and in no distress.  HENT:  Head: Normocephalic and atraumatic.  Mouth/Throat: Oropharynx is clear and moist.  Eyes: Pupils are equal, round, and reactive to light. Conjunctivae and EOM are normal.  Neck: No thyromegaly present.  Cardiovascular: Normal rate, regular rhythm, normal heart sounds and intact distal pulses.  No murmur heard. Pulmonary/Chest: Effort normal and breath sounds normal. No respiratory distress. She has no wheezes.  Abdominal: Soft. Bowel sounds are normal. She exhibits no distension. There is no abdominal tenderness. There is no rebound and no guarding.  Genitourinary:    Genitourinary Comments: Exam performed in the presence of a chaperone. External genitalia within normal  limits.  Vaginal mucosa pink, moist, normal rugae.  Nonfriable cervix without lesions, no discharge or bleeding noted on speculum exam.  Bimanual exam revealed normal, nongravid uterus.  No cervical motion tenderness. No adnexal masses bilaterally.     Musculoskeletal:        General: No deformity or edema. Normal range of motion.     Cervical back: Normal range of motion and neck supple.  Lymphadenopathy:    She has no cervical adenopathy.  Neurological: She is alert and oriented to person, place, and time. Gait normal.  Skin: Skin is warm and dry. No rash noted. She is not diaphoretic.  Psychiatric: Mood, affect and judgment normal.           Assessment & Plan:   1. Annual physical exam - CBC with Differential - Comprehensive metabolic panel - Lipid Panel  2. Pap smear for cervical cancer screening - Cytology - PAP([place])  3. Need for hepatitis C screening test - Hepatitis C Antibody  4. Screening for HIV (human immunodeficiency virus) - HIV antibody (with reflex)  5. Colon cancer screening - Ambulatory referral to Gastroenterology  6. Essential hypertension BP at goal today. Continue Amlodipine.  - CBC with Differential - Comprehensive metabolic panel - Lipid Panel  7. Screening for metabolic disorder - Lipid Panel  8. Screening for STD (sexually transmitted disease) - Cervicovaginal ancillary only - RPR      Phill Myron, D.O. 02/15/2020, 10:03 AM Primary Care at [facility]  [place]

## 2020-02-18 LAB — CBC WITH DIFFERENTIAL/PLATELET
Basophils Absolute: 0.1 10*3/uL (ref 0.0–0.2)
Basos: 1 %
EOS (ABSOLUTE): 0.3 10*3/uL (ref 0.0–0.4)
Eos: 5 %
Hematocrit: 39.2 % (ref 34.0–46.6)
Hemoglobin: 13.2 g/dL (ref 11.1–15.9)
Immature Grans (Abs): 0 10*3/uL (ref 0.0–0.1)
Immature Granulocytes: 0 %
Lymphocytes Absolute: 3 10*3/uL (ref 0.7–3.1)
Lymphs: 45 %
MCH: 31.4 pg (ref 26.6–33.0)
MCHC: 33.7 g/dL (ref 31.5–35.7)
MCV: 93 fL (ref 79–97)
Monocytes Absolute: 0.6 10*3/uL (ref 0.1–0.9)
Monocytes: 9 %
Neutrophils Absolute: 2.6 10*3/uL (ref 1.4–7.0)
Neutrophils: 40 %
Platelets: 305 10*3/uL (ref 150–450)
RBC: 4.21 x10E6/uL (ref 3.77–5.28)
RDW: 12.7 % (ref 11.7–15.4)
WBC: 6.5 10*3/uL (ref 3.4–10.8)

## 2020-02-18 LAB — COMPREHENSIVE METABOLIC PANEL
ALT: 15 IU/L (ref 0–32)
AST: 15 IU/L (ref 0–40)
Albumin/Globulin Ratio: 1.2 (ref 1.2–2.2)
Albumin: 4.4 g/dL (ref 3.8–4.9)
Alkaline Phosphatase: 100 IU/L (ref 39–117)
BUN/Creatinine Ratio: 14 (ref 9–23)
BUN: 14 mg/dL (ref 6–24)
Bilirubin Total: 0.4 mg/dL (ref 0.0–1.2)
CO2: 23 mmol/L (ref 20–29)
Calcium: 9.7 mg/dL (ref 8.7–10.2)
Chloride: 100 mmol/L (ref 96–106)
Creatinine, Ser: 1.02 mg/dL — ABNORMAL HIGH (ref 0.57–1.00)
GFR calc Af Amer: 71 mL/min/{1.73_m2} (ref >59)
GFR calc non Af Amer: 61 mL/min/{1.73_m2} (ref >59)
Globulin, Total: 3.6 g/dL (ref 1.5–4.5)
Glucose: 100 mg/dL — ABNORMAL HIGH (ref 65–99)
Potassium: 3.9 mmol/L (ref 3.5–5.2)
Sodium: 140 mmol/L (ref 134–144)
Total Protein: 8 g/dL (ref 6.0–8.5)

## 2020-02-18 LAB — RPR, QUANT+TP ABS (REFLEX)
Rapid Plasma Reagin, Quant: 1:1 {titer} — ABNORMAL HIGH
T Pallidum Abs: REACTIVE — AB

## 2020-02-18 LAB — HIV ANTIBODY (ROUTINE TESTING W REFLEX): HIV Screen 4th Generation wRfx: NONREACTIVE

## 2020-02-18 LAB — LIPID PANEL
Chol/HDL Ratio: 3.1 ratio (ref 0.0–4.4)
Cholesterol, Total: 190 mg/dL (ref 100–199)
HDL: 61 mg/dL (ref >39)
LDL Chol Calc (NIH): 113 mg/dL — ABNORMAL HIGH (ref 0–99)
Triglycerides: 87 mg/dL (ref 0–149)
VLDL Cholesterol Cal: 16 mg/dL (ref 5–40)

## 2020-02-18 LAB — RPR: RPR Ser Ql: REACTIVE — AB

## 2020-02-18 LAB — HEPATITIS C ANTIBODY: Hep C Virus Ab: <0.1 s/co ratio (ref 0.0–0.9)

## 2020-02-19 LAB — CERVICOVAGINAL ANCILLARY ONLY
Bacterial Vaginitis (gardnerella): POSITIVE — AB
Candida Glabrata: NEGATIVE
Candida Vaginitis: NEGATIVE
Chlamydia: NEGATIVE
Comment: NEGATIVE
Comment: NEGATIVE
Comment: NEGATIVE
Comment: NEGATIVE
Comment: NEGATIVE
Comment: NORMAL
Neisseria Gonorrhea: NEGATIVE
Trichomonas: NEGATIVE

## 2020-02-20 ENCOUNTER — Encounter: Payer: Self-pay | Admitting: Internal Medicine

## 2020-02-20 DIAGNOSIS — N880 Leukoplakia of cervix uteri: Secondary | ICD-10-CM | POA: Insufficient documentation

## 2020-02-20 LAB — CYTOLOGY - PAP
Adequacy: ABSENT
Comment: NEGATIVE
Diagnosis: NEGATIVE
Diagnosis: REACTIVE
High risk HPV: NEGATIVE

## 2020-03-06 NOTE — Progress Notes (Signed)
Patient notified of results & recommendations. Expressed understanding.  Patient states that she has never been diagnosed with Syphilis & hasn't had any lesions in her vaginal/rectal/mouth area. She says that her husband(possible ex-husband) mentioned that he had Syphilis in the past but that was over 10 years ago. She is unsure if he was ever treated.

## 2020-03-10 ENCOUNTER — Other Ambulatory Visit: Payer: Self-pay | Admitting: Internal Medicine

## 2020-03-10 DIAGNOSIS — A539 Syphilis, unspecified: Secondary | ICD-10-CM

## 2020-03-10 NOTE — Progress Notes (Signed)
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 Please have patient scheduled three weekly visits to have this administered. On her first visit prior to giving medication, please obtain a repeat RPR titer for baseline. We will need to monitor labs 6 months, 12 months, and 24 months after treatment. All partners should be tested and she should avoid sexual intercourse until 7 days after treatment is completed.   Phill Myron, D.O. Primary Care at [facility]  03/10/2020, 11:37 AM

## 2020-03-10 NOTE — Progress Notes (Signed)
Spoke with [facility]. 3 syringes of the Pen G 2.8m units will be delivered tomorrow.  Called patient & scheduled her for three nurse visits(3/25, 4/1 & 4/8).

## 2020-03-12 NOTE — Progress Notes (Signed)
Patient here for repeat RPR labs & to initiate syphilis treatment as ordered by PCP. Patient received an injection of Bicillin 1.2 million units/2 ML injection in each butt-cheek. Tolerated well. Is scheduled to return on 03/20/2020 & 03/27/2020 for the second & third round of injections. KWalker, CMA.

## 2020-03-13 ENCOUNTER — Other Ambulatory Visit: Payer: Self-pay

## 2020-03-13 ENCOUNTER — Ambulatory Visit (INDEPENDENT_AMBULATORY_CARE_PROVIDER_SITE_OTHER): Payer: 59

## 2020-03-13 DIAGNOSIS — A539 Syphilis, unspecified: Secondary | ICD-10-CM

## 2020-03-13 MED ORDER — PENICILLIN G BENZATHINE 1200000 UNIT/2ML IM SUSP
1.2000 10*6.[IU] | INTRAMUSCULAR | Status: AC
  Administered 2020-03-13 – 2020-03-27 (×3): 1.2 10*6.[IU] via INTRAMUSCULAR

## 2020-03-14 LAB — RAPID PLASMA REAGIN,QUANTITATION: RPR Titer: NONREACTIVE

## 2020-03-18 ENCOUNTER — Ambulatory Visit: Payer: 59 | Admitting: Allergy and Immunology

## 2020-03-18 ENCOUNTER — Other Ambulatory Visit: Payer: Self-pay

## 2020-03-18 ENCOUNTER — Encounter: Payer: Self-pay | Admitting: Allergy and Immunology

## 2020-03-18 VITALS — BP 132/76 | HR 82 | Temp 98.0°F | Resp 16 | Ht 69.0 in | Wt 240.4 lb

## 2020-03-18 DIAGNOSIS — H1010 Acute atopic conjunctivitis, unspecified eye: Secondary | ICD-10-CM | POA: Diagnosis not present

## 2020-03-18 DIAGNOSIS — J4540 Moderate persistent asthma, uncomplicated: Secondary | ICD-10-CM | POA: Diagnosis not present

## 2020-03-18 DIAGNOSIS — J3089 Other allergic rhinitis: Secondary | ICD-10-CM | POA: Diagnosis not present

## 2020-03-18 DIAGNOSIS — K219 Gastro-esophageal reflux disease without esophagitis: Secondary | ICD-10-CM

## 2020-03-18 MED ORDER — ALBUTEROL SULFATE (2.5 MG/3ML) 0.083% IN NEBU: 2.5000 mg | INHALATION_SOLUTION | RESPIRATORY_TRACT | 1 refills | Status: DC | PRN

## 2020-03-18 NOTE — Progress Notes (Signed)
[facility]   Follow-up Note  Referring Provider: Caryl Combs* Primary Provider: Nicolette Bang, DO Date of Office Visit: 03/18/2020  Subjective:   Amy Combs (DOB: 1961-09-30) is a 59 y.o. female who returns to the [facility] on 03/18/2020 in re-evaluation of the following:  HPI: Amy Combs returns to this clinic in reevaluation of asthma and allergic rhinoconjunctivitis and LPR.  Her last visit to this clinic was 19 June 2019.  She has had excellent control of her asthma while using Symbicort on a consistent basis.  Rarely does she use a short acting bronchodilator and she can exert herself without any problem and she has not required the administration of a systemic steroid to treat an exacerbation.  On only 1 occasion did she need to add in her Qvar as part of her "action plan".  Her nose has really been doing quite well and she has not required an antibiotic to treat an episode of sinusitis.  Her reflux is under excellent control as long as she continues on a combination of pantoprazole and famotidine on a consistent basis.  She has obtained her first [place] Covid vaccination and did receive the flu vaccine last season.  Allergies as of 03/18/2020      Reactions   Other    Other reaction(s): Eye Redness   Lisinopril Swelling      Medication List      albuterol (2.5 MG/3ML) 0.083% nebulizer solution Commonly known as: PROVENTIL Take 3 mLs (2.5 mg total) by nebulization every 4 (four) hours as needed. Started by: Amy Prows, MD   amLODipine 10 MG tablet Commonly known as: NORVASC Take 10 mg by mouth daily.   beclomethasone 80 MCG/ACT inhaler Commonly known as: QVAR Inhale 2 puffs into the lungs 2 (two) times daily.   Bepotastine Besilate 1.5 % Soln Commonly known as: Bepreve 1 drop each eye 2 x daily   budesonide-formoterol 160-4.5 MCG/ACT inhaler Commonly known as:  Symbicort Inhale 2 puffs into the lungs 2 (two) times daily.   chlorthalidone 25 MG tablet Commonly known as: HYGROTON Take 25 mg by mouth daily.   famotidine 40 MG tablet Commonly known as: Pepcid Take 1 tablet (40 mg total) by mouth daily.   latanoprost 0.005 % ophthalmic solution Commonly known as: XALATAN   montelukast 10 MG tablet Commonly known as: SINGULAIR Take 10 mg by mouth at bedtime.   ONE DAILY FOR WOMEN PO Take by mouth daily.   pantoprazole 40 MG tablet Commonly known as: PROTONIX pantoprazole 40 mg tablet,delayed release  TAKE 1 TABLET BY MOUTH EVERY DAY IN THE MORNING   Simbrinza 1-0.2 % Susp Generic drug: Brinzolamide-Brimonidine Place 1 drop into the right eye 2 (two) times daily.   telmisartan 40 MG tablet Commonly known as: MICARDIS Take 40 mg by mouth daily.   vitamin B-12 500 MCG tablet Commonly known as: CYANOCOBALAMIN Take 500 mcg by mouth daily.       Past Medical History:  Diagnosis Date  . Asthma   . GERD (gastroesophageal reflux disease)   . Hypertension   . Primary open angle glaucoma (POAG) of right eye, severe stage     Past Surgical History:  Procedure Laterality Date  . ABDOMINAL SURGERY     mesh placed  . CESAREAN SECTION CLASSICAL    . TUBAL LIGATION      Review of systems negative except as noted in HPI / PMHx or noted  below:  Review of Systems  Constitutional: Negative.   HENT: Negative.   Eyes: Negative.   Respiratory: Negative.   Cardiovascular: Negative.   Gastrointestinal: Negative.   Genitourinary: Negative.   Musculoskeletal: Negative.   Skin: Negative.   Neurological: Negative.   Endo/Heme/Allergies: Negative.   Psychiatric/Behavioral: Negative.      Objective:   Vitals:   03/18/20 1527  BP: 132/76  Pulse: 82  Resp: 16  Temp: 98 F (36.7 C)  SpO2: 98%   Height: 5\' 9"  (175.3 cm)  Weight: 240 lb 6.4 oz (109 kg)   Physical Exam Constitutional:      Appearance: She is not diaphoretic.    HENT:     Head: Normocephalic.     Right Ear: Tympanic membrane, ear canal and external ear normal.     Left Ear: Tympanic membrane, ear canal and external ear normal.     Nose: Nose normal. No mucosal edema or rhinorrhea.     Mouth/Throat:     Pharynx: Uvula midline. No oropharyngeal exudate.  Eyes:     Conjunctiva/sclera: Conjunctivae normal.  Neck:     Thyroid: No thyromegaly.     Trachea: Trachea normal. No tracheal tenderness or tracheal deviation.  Cardiovascular:     Rate and Rhythm: Normal rate and regular rhythm.     Heart sounds: Normal heart sounds, S1 normal and S2 normal. No murmur.  Pulmonary:     Effort: No respiratory distress.     Breath sounds: Normal breath sounds. No stridor. No wheezing or rales.  Lymphadenopathy:     Head:     Right side of head: No tonsillar adenopathy.     Left side of head: No tonsillar adenopathy.     Cervical: No cervical adenopathy.  Skin:    Findings: No erythema or rash.     Nails: There is no clubbing.  Neurological:     Mental Status: She is alert.     Diagnostics:    Spirometry was performed and demonstrated an FEV1 of 1.61 at 61 % of predicted.  The patient had an Asthma Control Test with the following results: ACT Total Score: 23.    Assessment and Plan:   1. Asthma, moderate persistent, well-controlled   2. Other allergic rhinitis   3. Seasonal allergic conjunctivitis   4. LPRD (laryngopharyngeal reflux disease)     1. Continue Symbicort 160 2 inhalations twice a day  2. Continue pantoprazole 40 mg in the morning and famotidine 40  mg in evening  3. Continue ProAir HFA or albuterol nebulization every 4-6 hours if needed  4. Continue OTC antihistamine and Patanol/Bepreve if needed  5. "Action plan" for asthma flare up:   A. continue Symbicort 160 2 inhalations twice a day  B. add Qvar 80 3 inhalations 3 times a day  C. use ProAir HFA or albuterol nebulization if needed  6. Return to clinic in 6 months or  earlier if problem  7. Obtain fall flu vaccine  Amy Combs appears to have very good control of her multiorgan atopic disease and her LPR on her current plan which includes anti-inflammatory agents for her airway and a aggressive approach to her reflux.  Assuming she continues to do well with this plan I will see her back in this clinic in 6 months or earlier if there is a problem.  Amy Katz, MD Allergy / Immunology [place]

## 2020-03-18 NOTE — Patient Instructions (Signed)
  1. Continue Symbicort 160 2 inhalations twice a day  2. Continue pantoprazole 40 mg in the morning and famotidine 40  mg in evening  3. Continue ProAir HFA or albuterol nebulization every 4-6 hours if needed  4. Continue OTC antihistamine and Patanol/Bepreve if needed  5. "Action plan" for asthma flare up:   A. continue Symbicort 160 2 inhalations twice a day  B. add Qvar 80 3 inhalations 3 times a day  C. use ProAir HFA or albuterol nebulization if needed  6. Return to clinic in 6 months or earlier if problem  7. Obtain fall flu vaccine

## 2020-03-19 ENCOUNTER — Encounter: Payer: Self-pay | Admitting: Allergy and Immunology

## 2020-03-20 ENCOUNTER — Ambulatory Visit (INDEPENDENT_AMBULATORY_CARE_PROVIDER_SITE_OTHER): Payer: 59

## 2020-03-20 ENCOUNTER — Other Ambulatory Visit: Payer: Self-pay

## 2020-03-20 DIAGNOSIS — A539 Syphilis, unspecified: Secondary | ICD-10-CM

## 2020-03-20 NOTE — Progress Notes (Signed)
Patient here for second round of Bicillin injections. KWalker, CMA.

## 2020-03-27 ENCOUNTER — Other Ambulatory Visit: Payer: Self-pay

## 2020-03-27 ENCOUNTER — Ambulatory Visit (INDEPENDENT_AMBULATORY_CARE_PROVIDER_SITE_OTHER): Payer: 59

## 2020-03-27 DIAGNOSIS — A539 Syphilis, unspecified: Secondary | ICD-10-CM | POA: Diagnosis not present

## 2020-03-27 NOTE — Progress Notes (Signed)
Patient here for round 3 of Bi-Cillin injections.

## 2020-04-08 ENCOUNTER — Other Ambulatory Visit: Payer: Self-pay

## 2020-04-08 MED ORDER — CHLORTHALIDONE 25 MG PO TABS: 25.0000 mg | ORAL_TABLET | Freq: Every day | ORAL | 1 refills | Status: DC

## 2020-04-08 MED ORDER — AMLODIPINE BESYLATE 10 MG PO TABS: 10.0000 mg | ORAL_TABLET | Freq: Every day | ORAL | 1 refills | Status: DC

## 2020-04-08 MED ORDER — TELMISARTAN 40 MG PO TABS: 40.0000 mg | ORAL_TABLET | Freq: Every day | ORAL | 1 refills | Status: DC

## 2020-04-10 ENCOUNTER — Encounter: Payer: Self-pay | Admitting: Gastroenterology

## 2020-04-14 ENCOUNTER — Encounter: Payer: Self-pay | Admitting: Gastroenterology

## 2020-04-17 ENCOUNTER — Telehealth: Payer: Self-pay

## 2020-04-17 NOTE — Telephone Encounter (Signed)
There is not a medication that I can call in for syphilis. I would suggest that she be seen in person for the outbreak. It is possibly related to syphilis but it could also be related to many other things which would be difficult to assess virtually. The three weeks should be adequate treatment.   Phill Myron, D.O. Primary Care at [facility]  04/17/2020, 2:02 PM

## 2020-04-17 NOTE — Telephone Encounter (Signed)
Patient called in requesting medication for an outbreak.  She states that she had an outbreak on her L buttock last week which has mostly cleared up but now she feels like more bumps/sores are starting to pop up. She is concerned that the 3 weeks of shots didn't help.  Please advise.

## 2020-04-17 NOTE — Telephone Encounter (Signed)
Please schedule an in person appt

## 2020-04-17 NOTE — Telephone Encounter (Signed)
Called pt lvm .

## 2020-04-18 ENCOUNTER — Other Ambulatory Visit: Payer: Self-pay

## 2020-04-18 ENCOUNTER — Encounter: Payer: Self-pay | Admitting: Internal Medicine

## 2020-04-18 ENCOUNTER — Ambulatory Visit (INDEPENDENT_AMBULATORY_CARE_PROVIDER_SITE_OTHER): Payer: 59 | Admitting: Internal Medicine

## 2020-04-18 VITALS — BP 137/76 | HR 71 | Temp 97.5°F | Resp 17 | Wt 230.0 lb

## 2020-04-18 DIAGNOSIS — A539 Syphilis, unspecified: Secondary | ICD-10-CM

## 2020-04-18 DIAGNOSIS — R21 Rash and other nonspecific skin eruption: Secondary | ICD-10-CM | POA: Diagnosis not present

## 2020-04-18 MED ORDER — VALACYCLOVIR HCL 1 G PO TABS: 1000.0000 mg | ORAL_TABLET | Freq: Two times a day (BID) | ORAL | 0 refills | Status: DC

## 2020-04-18 NOTE — Progress Notes (Signed)
Subjective:    Amy Combs - 59 y.o. female MRN [id number]  Date of birth: 11-28-1961  HPI  Amy Combs is here for concern about a rash that developed on her left buttocks. 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 Earlier in the day of PCN treatment, she had also received her COVID vaccine. Patient reports rash initially appears to be multiple small blisters. She placed hydrogen peroxide on the rash and it seemed to dry up. She has had some burning and itching related to the rash. Unsure if she has a h/o HSV but does report she had a similar rash in the same location several years ago when she first had sexual intercourse with her husband. Rash is not and was not previously present anywhere else on her body. Otherwise feels well without fevers, abdominal pain, night sweats, vomiting, headaches, myalgias.      Health Maintenance:  Health Maintenance Due  Topic Date Due  . COLONOSCOPY  Never done    -  reports that she has never smoked. She has never used smokeless tobacco. - Review of Systems: Per HPI. - Past Medical History: Patient Active Problem List   Diagnosis Date Noted  . Syphilis 03/10/2020  . Hyperkeratosis of cervix 02/20/2020  . Essential hypertension 01/15/2020  . Asthma 01/15/2020  . Arthritis of both knees 04/10/2018  . Primary open angle glaucoma (POAG) of right eye, severe stage 01/02/2018  . Combined form of age-related cataract, both eyes 09/30/2017   - Medications: reviewed and updated   Objective:   Physical Exam BP 137/76   Pulse 71   Temp (!) 97.5 F (36.4 C) (Temporal)   Resp 17   Wt 230 lb (104.3 kg)   LMP 02/27/2016   SpO2 96%   BMI 33.97 kg/m  Physical Exam  Constitutional: She is oriented to person, place, and time and well-developed, well-nourished, and in no distress. No distress.  HENT:  No oral mucosal lesions.   Cardiovascular: Normal rate.  Pulmonary/Chest: Effort  normal. No respiratory distress.  Musculoskeletal:        General: Normal range of motion.  Neurological: She is alert and oriented to person, place, and time.  Skin: Skin is warm and dry. She is not diaphoretic.  Left buttocks with area of clustered hyperpigmented, dry macules and papules. No vesicles appreciated. Slighted darkened base that the rash is present on.   No rash present on trunk, back, or extremities. No involvement of palms or soles.   Psychiatric: Affect and judgment normal.           Assessment & Plan:   1. 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 Discussed with patient that will plan to repeat labs 6, 12, and 24 months after treatment completion.   2. Rash and nonspecific skin eruption Appears most consistent with healing herpetic rash. More likely given report that she has had similar rash in the past and the timing of receiving COVID vaccination could have prompted recurrence of HSV. Culture was obtained although was unable to unroof any lesions so may be low yield. Will obtain HSV serum tests. Valtrex prescribed empirically. Low suspicion for secondary syphilis as typically does not present in vesicular pattern and rash is not diffuse and she has not classic systemic symptoms. She was treated with 3  weeks of PCN and did not miss any doses. A PCN reaction rash is a possibility given timing but would also anticipate a more diffuse rash presentation.  - Herpes simplex virus culture - HSV(herpes simplex vrs) 1+2 ab-IgM - HSV(herpes simplex vrs) 1+2 ab-IgG - valACYclovir (VALTREX) 1000 MG tablet; Take 1 tablet (1,000 mg total) by mouth 2 (two) times daily.  Dispense: 20 tablet; Refill: 0      Phill Myron, D.O. 04/18/2020, 10:41 AM Primary Care at [facility]

## 2020-04-21 LAB — HERPES SIMPLEX VIRUS CULTURE

## 2020-04-23 ENCOUNTER — Encounter: Payer: Self-pay | Admitting: Internal Medicine

## 2020-04-23 DIAGNOSIS — R768 Other specified abnormal immunological findings in serum: Secondary | ICD-10-CM | POA: Insufficient documentation

## 2020-04-23 DIAGNOSIS — R7689 Other specified abnormal immunological findings in serum: Secondary | ICD-10-CM | POA: Insufficient documentation

## 2020-04-23 LAB — HSV(HERPES SIMPLEX VRS) I + II AB-IGG
HSV 1 Glycoprotein G Ab, IgG: <0.91 index (ref 0.00–0.90)
HSV 2 IgG, Type Spec: 16 index — ABNORMAL HIGH (ref 0.00–0.90)

## 2020-04-23 LAB — HSV(HERPES SIMPLEX VRS) I + II AB-IGM: HSVI/II Comb IgM: 1.05 Ratio — ABNORMAL HIGH (ref 0.00–0.90)

## 2020-04-23 NOTE — Progress Notes (Signed)
Patient notified of results & recommendations. Expressed understanding.  Patient states that the outbreak hasn't fully healed. She would like a longer Rx of the Valtrex sent to her pharmacy.

## 2020-04-24 ENCOUNTER — Other Ambulatory Visit: Payer: Self-pay | Admitting: Internal Medicine

## 2020-04-24 DIAGNOSIS — R21 Rash and other nonspecific skin eruption: Secondary | ICD-10-CM

## 2020-04-24 MED ORDER — VALACYCLOVIR HCL 1 G PO TABS: 1000.0000 mg | ORAL_TABLET | Freq: Two times a day (BID) | ORAL | 2 refills | Status: DC

## 2020-05-28 ENCOUNTER — Ambulatory Visit (AMBULATORY_SURGERY_CENTER): Payer: Self-pay | Admitting: *Deleted

## 2020-05-28 ENCOUNTER — Other Ambulatory Visit: Payer: Self-pay

## 2020-05-28 VITALS — Ht 69.0 in | Wt 239.4 lb

## 2020-05-28 DIAGNOSIS — Z1211 Encounter for screening for malignant neoplasm of colon: Secondary | ICD-10-CM

## 2020-05-28 NOTE — Progress Notes (Signed)
2nd dose of covid vaccine 03-30-20  Pt is aware that care partner will wait in the car during procedure; if they feel like they will be too hot or cold to wait in the car; they may wait in the 4 th floor lobby. Patient is aware to bring only one care partner. We want them to wear a mask (we do not have any that we can provide them), practice social distancing, and we will check their temperatures when they get here.  I did remind the patient that their care partner needs to stay in the parking lot the entire time and have a cell phone available, we will call them when the pt is ready for discharge. Patient will wear mask into building.   No trouble with anesthesia, difficulty with intubation or hx/fam hx of malignant hyperthermia per pt   No egg or soy allergy  No home oxygen use   No medications for weight loss taken  emmi information given  Pt denies constipation issues

## 2020-05-30 ENCOUNTER — Encounter: Payer: 59 | Admitting: Gastroenterology

## 2020-05-30 ENCOUNTER — Encounter: Payer: Self-pay | Admitting: Gastroenterology

## 2020-06-06 ENCOUNTER — Other Ambulatory Visit: Payer: Self-pay

## 2020-06-06 ENCOUNTER — Encounter: Payer: Self-pay | Admitting: Gastroenterology

## 2020-06-06 ENCOUNTER — Ambulatory Visit (AMBULATORY_SURGERY_CENTER): Payer: No Typology Code available for payment source | Admitting: Gastroenterology

## 2020-06-06 VITALS — BP 106/76 | HR 65 | Temp 97.3°F | Resp 18 | Ht 69.0 in | Wt 239.4 lb

## 2020-06-06 DIAGNOSIS — D122 Benign neoplasm of ascending colon: Secondary | ICD-10-CM

## 2020-06-06 DIAGNOSIS — Z1211 Encounter for screening for malignant neoplasm of colon: Secondary | ICD-10-CM

## 2020-06-06 HISTORY — PX: COLONOSCOPY: SHX174

## 2020-06-06 MED ORDER — SODIUM CHLORIDE 0.9 % IV SOLN: 500.0000 mL | Freq: Once | INTRAVENOUS | Status: DC

## 2020-06-06 NOTE — Op Note (Signed)
[facility] Patient Name: Amy Combs Procedure Date: 06/06/2020 9:11 AM MRN: [id number] Endoscopist: Mallie Mussel L. Loletha Carrow , MD Age: 59 Referring MD:  Date of Birth: 1961-01-21 Gender: Female Account #: [id number] Procedure:                Colonoscopy Indications:              Screening for colorectal malignant neoplasm, This                            is the patient's first colonoscopy Medicines:                Monitored Anesthesia Care Procedure:                Pre-Anesthesia Assessment:                           - Prior to the procedure, a History and Physical                            was performed, and patient medications and                            allergies were reviewed. The patient's tolerance of                            previous anesthesia was also reviewed. The risks                            and benefits of the procedure and the sedation                            options and risks were discussed with the patient.                            All questions were answered, and informed consent                            was obtained. Prior Anticoagulants: The patient has                            taken no previous anticoagulant or antiplatelet                            agents. ASA Grade Assessment: II - A patient with                            mild systemic disease. After reviewing the risks                            and benefits, the patient was deemed in                            satisfactory condition to undergo the procedure.  After obtaining informed consent, the colonoscope                            was passed under direct vision. Throughout the                            procedure, the patient's blood pressure, pulse, and                            oxygen saturations were monitored continuously. The                            Colonoscope was introduced through the anus and                            advanced to the the  ascending colon. The                            Colonoscope was introduced through the and advanced                            to the mid ascending colon. The colonoscopy was                            extremely difficult due to multiple diverticula in                            the colon, a redundant colon, significant looping,                            a tortuous colon and the patient's body habitus.                            Despite changing the patient to a semi-supine                            position, changing to a pediatric colonoscope,                            water inflation and using manual pressure, the                            scope could not be advanced beyond the mid                            ascending colon. The patient tolerated the                            procedure well. The quality of the bowel                            preparation was good. Rectum, ascending and left  colon were photographed. The bowel preparation used                            was Miralax. Scope In: 9:24:35 AM Scope Out: 9:53:01 AM Scope Withdrawal Time: 0 hours 9 minutes 31 seconds  Total Procedure Duration: 0 hours 28 minutes 26 seconds  Findings:                 The perianal and digital rectal examinations were                            normal.                           A 5 mm polyp was found in the mid ascending colon.                            The polyp was sessile. The polyp was removed with a                            cold snare. Resection and retrieval were complete.                           Multiple diverticula were found in the left colon.                           The left colon was significantly tortuous. Changing                            to pediatric scope allowed passage through left                            colon, but then cecum could not be reached due to                            colon looping (see above)                           The  sigmoid colon was significantly redundant.                           The exam was otherwise without abnormality on                            direct and retroflexion views. Complications:            No immediate complications. Estimated Blood Loss:     Estimated blood loss was minimal. Impression:               - One 5 mm polyp in the mid ascending colon,                            removed with a cold snare. Resected and retrieved.                           -  Diverticulosis in the left colon.                           - Tortuous colon.                           - Redundant colon.                           - The examination was otherwise normal on direct                            and retroflexion views. Recommendation:           - Patient has a contact number available for                            emergencies. The signs and symptoms of potential                            delayed complications were discussed with the                            patient. Return to normal activities tomorrow.                            Written discharge instructions were provided to the                            patient.                           - Resume previous diet.                           - Continue present medications.                           - Await pathology results.                           - Repeat colonoscopy within 6 months because the                            examination was incomplete. Hospital outpatient                            procedure with advanced endoscopist. Mallie Mussel L. Loletha Carrow, MD 06/06/2020 10:05:11 AM This report has been signed electronically.

## 2020-06-06 NOTE — Progress Notes (Signed)
No problems noted in the recovery room. maw 

## 2020-06-06 NOTE — Progress Notes (Signed)
Pt's states no medical or surgical changes since previsit or office visit.  ° °Vitals CW °

## 2020-06-06 NOTE — Progress Notes (Signed)
pt tolerated well. VSS. awake and to recovery. Report given to RN.  

## 2020-06-06 NOTE — Patient Instructions (Addendum)
YOU HAD AN ENDOSCOPIC PROCEDURE TODAY AT [facility]:   Refer to the procedure report that was given to you for any specific questions about what was found during the examination.  If the procedure report does not answer your questions, please call your gastroenterologist to clarify.  If you requested that your care partner not be given the details of your procedure findings, then the procedure report has been included in a sealed envelope for you to review at your convenience later.  YOU SHOULD EXPECT: Some feelings of bloating in the abdomen. Passage of more gas than usual.  Walking can help get rid of the air that was put into your GI tract during the procedure and reduce the bloating. If you had a lower endoscopy (such as a colonoscopy or flexible sigmoidoscopy) you may notice spotting of blood in your stool or on the toilet paper. If you underwent a bowel prep for your procedure, you may not have a normal bowel movement for a few days.  Please Note:  You might notice some irritation and congestion in your nose or some drainage.  This is from the oxygen used during your procedure.  There is no need for concern and it should clear up in a day or so.  SYMPTOMS TO REPORT IMMEDIATELY:   Following lower endoscopy (colonoscopy or flexible sigmoidoscopy):  Excessive amounts of blood in the stool  Significant tenderness or worsening of abdominal pains  Swelling of the abdomen that is new, acute  Fever of 100F or higher   Black, tarry-looking stools  For urgent or emergent issues, a gastroenterologist can be reached at any hour by calling [phone]. Do not use MyChart messaging for urgent concerns.    DIET:  We do recommend a small meal at first, but then you may proceed to your regular diet.  Drink plenty of fluids but you should avoid alcoholic beverages for 24 hours.  ACTIVITY:  You should plan to take it easy for the rest of today and you should NOT DRIVE or use heavy  machinery until tomorrow (because of the sedation medicines used during the test).    FOLLOW UP: Our staff will call the number listed on your records 48-72 hours following your procedure to check on you and address any questions or concerns that you may have regarding the information given to you following your procedure. If we do not reach you, we will leave a message.  We will attempt to reach you two times.  During this call, we will ask if you have developed any symptoms of COVID 19. If you develop any symptoms (ie: fever, flu-like symptoms, shortness of breath, cough etc.) before then, please call [phone].  If you test positive for Covid 19 in the 2 weeks post procedure, please call and report this information to [place].    If any biopsies were taken you will be contacted by phone or by letter within the next 1-3 weeks.  Please call us at [phone] if you have not heard about the biopsies in 3 weeks.    SIGNATURES/CONFIDENTIALITY: You and/or your care partner have signed paperwork which will be entered into your electronic medical record.  These signatures attest to the fact that that the information above on your After Visit Summary has been reviewed and is understood.  Full responsibility of the confidentiality of this discharge information lies with you and/or your care-partner.     Handouts were given to you on polyps and diverticulosis. You  may resume your current medications today. Await biopsy results. Repeat colonoscopy within 6 months because the examination was in complete.  Needs to be scheduled at hospital out patient colonoscopy with advance endoscopist. Please call if any questions or concerns.

## 2020-06-07 ENCOUNTER — Other Ambulatory Visit: Payer: Self-pay | Admitting: Allergy and Immunology

## 2020-06-10 ENCOUNTER — Telehealth: Payer: Self-pay | Admitting: *Deleted

## 2020-06-10 NOTE — Telephone Encounter (Signed)
  Follow up Call-  Call back number 06/06/2020  Post procedure Call Back phone  # [phone]  Permission to leave phone message Yes  Some recent data might be hidden     Patient questions:  Do you have a fever, pain , or abdominal swelling? No. Pain Score  0 *  Have you tolerated food without any problems? Yes.    Have you been able to return to your normal activities? Yes.    Do you have any questions about your discharge instructions: Diet   No. Medications  No. Follow up visit  No.  Do you have questions or concerns about your Care? No.  Actions: * If pain score is 4 or above: No action needed, pain <4.  1. Have you developed a fever since your procedure? no  2.   Have you had an respiratory symptoms (SOB or cough) since your procedure? no  3.   Have you tested positive for COVID 19 since your procedure no  4.   Have you had any family members/close contacts diagnosed with the COVID 19 since your procedure?  no   If yes to any of these questions please route to Joylene John, RN and Erenest Rasher, RN

## 2020-06-12 ENCOUNTER — Encounter: Payer: Self-pay | Admitting: Gastroenterology

## 2020-08-11 ENCOUNTER — Other Ambulatory Visit: Payer: Self-pay | Admitting: Allergy and Immunology

## 2020-08-14 ENCOUNTER — Ambulatory Visit: Payer: 59 | Admitting: Internal Medicine

## 2020-08-19 ENCOUNTER — Encounter: Payer: Self-pay | Admitting: Internal Medicine

## 2020-08-19 ENCOUNTER — Ambulatory Visit: Payer: 59 | Admitting: Allergy and Immunology

## 2020-08-19 ENCOUNTER — Telehealth (INDEPENDENT_AMBULATORY_CARE_PROVIDER_SITE_OTHER): Payer: No Typology Code available for payment source | Admitting: Internal Medicine

## 2020-08-19 DIAGNOSIS — A539 Syphilis, unspecified: Secondary | ICD-10-CM

## 2020-08-19 DIAGNOSIS — I10 Essential (primary) hypertension: Secondary | ICD-10-CM | POA: Diagnosis not present

## 2020-08-19 NOTE — Progress Notes (Signed)
Virtual Visit via Telephone Note  I connected with Amy Combs, on 08/19/2020 at 2:29 PM by telephone due to the COVID-19 pandemic and verified that I am speaking with the correct person using two identifiers.   Consent: I discussed the limitations, risks, security and privacy concerns of performing an evaluation and management service by telephone and the availability of in person appointments. I also discussed with the patient that there may be a patient responsible charge related to this service. The patient expressed understanding and agreed to proceed.   Location of Patient: Home   Location of Provider: Home    Persons participating in Telemedicine visit: Rosiland Sen [facility] Dr. Juleen China      History of Present Illness: Patient has a visit for f/u HTN.   Chronic HTN Disease Monitoring:  Home BP Monitoring - Not able to monitor at home  Chest pain- no  Dyspnea- no Headache - no  Medications: Amlodipine 10 mg, Chlorthalidone 25 mg, Telmisartan 40 mg  Compliance- yes Lightheadedness- no  Edema- no        Past Medical History:  Diagnosis Date  . Allergy   . Ankle swelling   . Asthma   . GERD (gastroesophageal reflux disease)   . Hypertension   . Primary open angle glaucoma (POAG) of right eye, severe stage    Allergies  Allergen Reactions  . Other     Other reaction(s): Eye Redness Dust, ragweed, grass  . Lisinopril Swelling    Current Outpatient Medications on File Prior to Visit  Medication Sig Dispense Refill  . albuterol (PROVENTIL) (2.5 MG/3ML) 0.083% nebulizer solution Take 3 mLs (2.5 mg total) by nebulization every 4 (four) hours as needed. 180 mL 1  . amLODipine (NORVASC) 10 MG tablet Take 1 tablet (10 mg total) by mouth daily. Dx Code: I10 90 tablet 1  . beclomethasone (QVAR) 80 MCG/ACT inhaler Inhale 2 puffs into the lungs 2 (two) times daily. Uses PRN    . Bepotastine Besilate (BEPREVE) 1.5 % SOLN 1 drop  each eye 2 x daily 10 mL 5  . chlorthalidone (HYGROTON) 25 MG tablet Take 1 tablet (25 mg total) by mouth daily. Dx Code: I10 90 tablet 1  . famotidine (PEPCID) 40 MG tablet TAKE 1 TABLET BY MOUTH EVERY DAY 90 tablet 0  . latanoprost (XALATAN) 0.005 % ophthalmic solution     . Multiple Vitamins-Minerals (ONE DAILY FOR WOMEN PO) Take by mouth daily.    . pantoprazole (PROTONIX) 40 MG tablet pantoprazole 40 mg tablet,delayed release  TAKE 1 TABLET BY MOUTH EVERY DAY IN THE MORNING    . SIMBRINZA 1-0.2 % SUSP Place 1 drop into the right eye 2 (two) times daily.    . SYMBICORT 160-4.5 MCG/ACT inhaler TAKE 2 PUFFS BY MOUTH TWICE A DAY 1 each 0  . telmisartan (MICARDIS) 40 MG tablet Take 1 tablet (40 mg total) by mouth daily. Dx Code: I10 90 tablet 1  . valACYclovir (VALTREX) 1000 MG tablet Take 1 tablet (1,000 mg total) by mouth 2 (two) times daily. (Patient taking differently: Take 1,000 mg by mouth 2 (two) times daily. Uses PRN) 30 tablet 2  . vitamin B-12 (CYANOCOBALAMIN) 500 MCG tablet Take 500 mcg by mouth daily.     No current facility-administered medications on file prior to visit.    Observations/Objective: NAD. Speaking clearly.  Work of breathing normal.  Alert and oriented. Mood appropriate.   Assessment and Plan: 1. Essential hypertension Not monitoring at home. Compliant with medications.  Asymptomatic. Continue current regimen. Will monitor at future visits.   2. Syphilis Unclear when exposure was. Presumed late latent status. Titer was 1:1 with reactive antibodies 2/26. Prior to therapy titer was non-reactive. Proceeded with 3 weeks of PCN. Will repeat titers 6 months after treatment, in October. Then plan to repeat at 12 months and 24 months.  - RPR; Future   Follow Up Instructions: Lab visit Oct 2021    I discussed the assessment and treatment plan with the patient. The patient was provided an opportunity to ask questions and all were answered. The patient agreed with the  plan and demonstrated an understanding of the instructions.   The patient was advised to call back or seek an in-person evaluation if the symptoms worsen or if the condition fails to improve as anticipated.     I provided 14 minutes total of non-face-to-face time during this encounter including median intraservice time, reviewing previous notes, investigations, ordering medications, medical decision making, coordinating care and patient verbalized understanding at the end of the visit.    Phill Myron, D.O. Primary Care at [facility]  08/19/2020, 2:29 PM

## 2020-09-23 ENCOUNTER — Encounter: Payer: Self-pay | Admitting: Allergy and Immunology

## 2020-09-23 ENCOUNTER — Other Ambulatory Visit: Payer: Self-pay

## 2020-09-23 ENCOUNTER — Ambulatory Visit (INDEPENDENT_AMBULATORY_CARE_PROVIDER_SITE_OTHER): Payer: No Typology Code available for payment source | Admitting: Allergy and Immunology

## 2020-09-23 VITALS — BP 130/82 | HR 70 | Temp 98.1°F | Resp 18 | Ht 69.0 in

## 2020-09-23 DIAGNOSIS — J3089 Other allergic rhinitis: Secondary | ICD-10-CM

## 2020-09-23 DIAGNOSIS — H1010 Acute atopic conjunctivitis, unspecified eye: Secondary | ICD-10-CM

## 2020-09-23 DIAGNOSIS — J4540 Moderate persistent asthma, uncomplicated: Secondary | ICD-10-CM

## 2020-09-23 DIAGNOSIS — K219 Gastro-esophageal reflux disease without esophagitis: Secondary | ICD-10-CM

## 2020-09-23 NOTE — Progress Notes (Signed)
[place]   Follow-up Note  Referring Provider: Caryl Combs* Primary Provider: Nicolette Bang, DO Date of Office Visit: 09/23/2020  Subjective:   Amy Combs (DOB: [birth year]) is a 59 y.o. female who returns to the [facility] on 09/23/2020 in re-evaluation of the following:  HPI: Amy Combs returns to this clinic in evaluation of asthma and allergic rhinoconjunctivitis and LPR.  Her last visit to this clinic was 18 March 2020.  Overall she has really done well with her asthma and has not required a systemic steroid to treat an exacerbation and rarely uses a short acting bronchodilator as long as she continues on Symbicort twice a day.  She has had very little problems with her upper airway and has not required an antibiotic to treat an episode of sinusitis.  As long as she remains on her treatment for reflux she does relatively well.  If she eats past 8 PM at nighttime she can feel it the next day with lots of throat issues even while using pantoprazole in the morning and famotidine in the evening.  She drinks 1 coffee per day.  She has obtained to [employer] vaccinations.  Allergies as of 09/23/2020      Reactions   Other    Other reaction(s): Eye Redness Dust, ragweed, grass   Lisinopril Swelling      Medication List    albuterol (2.5 MG/3ML) 0.083% nebulizer solution Commonly known as: PROVENTIL Take 3 mLs (2.5 mg total) by nebulization every 4 (four) hours as needed.   amLODipine 10 MG tablet Commonly known as: NORVASC Take 1 tablet (10 mg total) by mouth daily. Dx Code: I10   beclomethasone 80 MCG/ACT inhaler Commonly known as: QVAR Inhale 2 puffs into the lungs 2 (two) times daily. Uses PRN   chlorthalidone 25 MG tablet Commonly known as: HYGROTON Take 1 tablet (25 mg total) by mouth daily. Dx Code: I10   famotidine 40 MG tablet Commonly known as: PEPCID TAKE 1 TABLET  BY MOUTH EVERY DAY   latanoprost 0.005 % ophthalmic solution Commonly known as: XALATAN   ONE DAILY FOR WOMEN PO Take by mouth daily.   pantoprazole 40 MG tablet Commonly known as: PROTONIX pantoprazole 40 mg tablet,delayed release  TAKE 1 TABLET BY MOUTH EVERY DAY IN THE MORNING   Simbrinza 1-0.2 % Susp Generic drug: Brinzolamide-Brimonidine Place 1 drop into the right eye 2 (two) times daily.   Symbicort 160-4.5 MCG/ACT inhaler Generic drug: budesonide-formoterol TAKE 2 PUFFS BY MOUTH TWICE A DAY   telmisartan 40 MG tablet Commonly known as: MICARDIS Take 1 tablet (40 mg total) by mouth daily. Dx Code: I10   valACYclovir 1000 MG tablet Commonly known as: VALTREX Take 1 tablet (1,000 mg total) by mouth 2 (two) times daily.   vitamin B-12 500 MCG tablet Commonly known as: CYANOCOBALAMIN Take 500 mcg by mouth daily.       Past Medical History:  Diagnosis Date  . Allergy   . Ankle swelling   . Asthma   . GERD (gastroesophageal reflux disease)   . Hypertension   . Primary open angle glaucoma (POAG) of right eye, severe stage     Past Surgical History:  Procedure Laterality Date  . ABDOMINAL SURGERY     mesh placed for scar tissue  . CESAREAN SECTION CLASSICAL    . COLONOSCOPY  06/06/2020  . TUBAL LIGATION      Review of systems negative  except as noted in HPI / PMHx or noted below:  Review of Systems  Constitutional: Negative.   HENT: Negative.   Eyes: Negative.   Respiratory: Negative.   Cardiovascular: Negative.   Gastrointestinal: Negative.   Genitourinary: Negative.   Musculoskeletal: Negative.   Skin: Negative.   Neurological: Negative.   Endo/Heme/Allergies: Negative.   Psychiatric/Behavioral: Negative.      Objective:   Vitals:   09/23/20 1559  BP: 130/82  Pulse: 70  Resp: 18  Temp: 98.1 F (36.7 C)  SpO2: 98%   Height: 5\' 9"  (175.3 cm)      Physical Exam Constitutional:      Appearance: She is not diaphoretic.  HENT:      Head: Normocephalic.     Right Ear: Tympanic membrane, ear canal and external ear normal.     Left Ear: Tympanic membrane, ear canal and external ear normal.     Nose: Nose normal. No mucosal edema or rhinorrhea.     Mouth/Throat:     Pharynx: Uvula midline. No oropharyngeal exudate.  Eyes:     Conjunctiva/sclera: Conjunctivae normal.  Neck:     Thyroid: No thyromegaly.     Trachea: Trachea normal. No tracheal tenderness or tracheal deviation.  Cardiovascular:     Rate and Rhythm: Normal rate and regular rhythm.     Heart sounds: Normal heart sounds, S1 normal and S2 normal. No murmur heard.   Pulmonary:     Effort: No respiratory distress.     Breath sounds: Normal breath sounds. No stridor. No wheezing or rales.  Lymphadenopathy:     Head:     Right side of head: No tonsillar adenopathy.     Left side of head: No tonsillar adenopathy.     Cervical: No cervical adenopathy.  Skin:    Findings: No erythema or rash.     Nails: There is no clubbing.  Neurological:     Mental Status: She is alert.     Diagnostics:    Spirometry was performed and demonstrated an FEV1 of 1.97 at 75 % of predicted.  The patient had an Asthma Control Test with the following results: ACT Total Score: 23.    Assessment and Plan:   1. Asthma, moderate persistent, well-controlled   2. Other allergic rhinitis   3. Seasonal allergic conjunctivitis   4. LPRD (laryngopharyngeal reflux disease)     1. Continue Symbicort 160 2 inhalations twice a day  2. Continue pantoprazole 40 mg in the morning and famotidine 40  mg in evening  3. Continue ProAir HFA or albuterol nebulization every 4-6 hours if needed  4. Continue OTC antihistamine if needed  5. "Action plan" for asthma flare up:   A. continue Symbicort 160 2 inhalations twice a day  B. add Qvar 80 3 inhalations 3 times a day  C. use ProAir HFA or albuterol nebulization if needed  6. Return to clinic in 6 months or earlier if problem  7.  Obtain fall flu vaccine and Covid booster  [name] appears to be doing very well on her current therapy and she will remain on anti-inflammatory agents for her airway and an aggressive therapy directed against her reflux.  Her reflux is quite significant and I did have a talk with her today about some of the options for treating this issue including coming off all of her caffeine and possibly considering a Nissen fundoplication.  I will see her back in this clinic in 6 months or earlier if there  is a problem.  [name], MD Allergy / Immunology [place]

## 2020-09-23 NOTE — Patient Instructions (Addendum)
  1. Continue Symbicort 160 2 inhalations twice a day  2. Continue pantoprazole 40 mg in the morning and famotidine 40  mg in evening  3. Continue ProAir HFA or albuterol nebulization every 4-6 hours if needed  4. Continue OTC antihistamine if needed  5. "Action plan" for asthma flare up:   A. continue Symbicort 160 2 inhalations twice a day  B. add Qvar 80 3 inhalations 3 times a day  C. use ProAir HFA or albuterol nebulization if needed  6. Return to clinic in 6 months or earlier if problem  7. Obtain fall flu vaccine and Covid booster

## 2020-09-24 ENCOUNTER — Other Ambulatory Visit: Payer: Self-pay

## 2020-09-24 ENCOUNTER — Encounter: Payer: Self-pay | Admitting: Allergy and Immunology

## 2020-09-24 ENCOUNTER — Telehealth: Payer: Self-pay | Admitting: Allergy and Immunology

## 2020-09-24 MED ORDER — BUDESONIDE-FORMOTEROL FUMARATE 160-4.5 MCG/ACT IN AERO: INHALATION_SPRAY | RESPIRATORY_TRACT | 5 refills | Status: DC

## 2020-09-24 NOTE — Telephone Encounter (Signed)
Refill sent in to [facility] pharmacy  and patient was notified

## 2020-09-24 NOTE — Telephone Encounter (Signed)
Patient was seen yesterday, 09/23/20, by Dr. Neldon Mc. A prescription for Symbicort was supposed to be send to [facility] on [address] and the pharmacy never received it. Could it be resent?

## 2020-10-03 ENCOUNTER — Other Ambulatory Visit: Payer: 59

## 2020-10-05 ENCOUNTER — Other Ambulatory Visit: Payer: Self-pay | Admitting: Allergy and Immunology

## 2020-10-09 ENCOUNTER — Other Ambulatory Visit: Payer: Self-pay

## 2020-10-09 MED ORDER — CHLORTHALIDONE 25 MG PO TABS
25.0000 mg | ORAL_TABLET | Freq: Every day | ORAL | 1 refills | Status: DC
Start: 2020-10-09 — End: 2021-09-29

## 2020-10-09 MED ORDER — TELMISARTAN 40 MG PO TABS
40.0000 mg | ORAL_TABLET | Freq: Every day | ORAL | 1 refills | Status: DC
Start: 2020-10-09 — End: 2021-09-29

## 2020-10-09 MED ORDER — AMLODIPINE BESYLATE 10 MG PO TABS
10.0000 mg | ORAL_TABLET | Freq: Every day | ORAL | 1 refills | Status: DC
Start: 2020-10-09 — End: 2021-09-29

## 2020-10-15 ENCOUNTER — Ambulatory Visit (INDEPENDENT_AMBULATORY_CARE_PROVIDER_SITE_OTHER): Payer: No Typology Code available for payment source | Admitting: Physician Assistant

## 2020-10-15 ENCOUNTER — Other Ambulatory Visit (HOSPITAL_COMMUNITY)
Admission: RE | Admit: 2020-10-15 | Discharge: 2020-10-15 | Disposition: A | Discharge status: Home / Self Care | Payer: No Typology Code available for payment source | Source: Ambulatory Visit | Attending: Physician Assistant | Admitting: Physician Assistant

## 2020-10-15 ENCOUNTER — Encounter: Payer: Self-pay | Admitting: Physician Assistant

## 2020-10-15 ENCOUNTER — Other Ambulatory Visit: Payer: Self-pay

## 2020-10-15 VITALS — BP 156/86 | HR 74 | Temp 98.1°F | Resp 18 | Ht 69.0 in | Wt 236.0 lb

## 2020-10-15 DIAGNOSIS — N760 Acute vaginitis: Secondary | ICD-10-CM

## 2020-10-15 DIAGNOSIS — A599 Trichomoniasis, unspecified: Secondary | ICD-10-CM | POA: Diagnosis not present

## 2020-10-15 DIAGNOSIS — K649 Unspecified hemorrhoids: Secondary | ICD-10-CM

## 2020-10-15 DIAGNOSIS — B9689 Other specified bacterial agents as the cause of diseases classified elsewhere: Secondary | ICD-10-CM

## 2020-10-15 LAB — POCT URINALYSIS DIP (CLINITEK)
Bilirubin, UA: NEGATIVE
Glucose, UA: NEGATIVE mg/dL
Ketones, POC UA: NEGATIVE mg/dL
Nitrite, UA: NEGATIVE
POC PROTEIN,UA: 30 — AB
Spec Grav, UA: >=1.03 — AB (ref 1.010–1.025)
Urobilinogen, UA: 0.2 E.U./dL
pH, UA: 5.5 (ref 5.0–8.0)

## 2020-10-15 MED ORDER — METRONIDAZOLE 500 MG PO TABS: 500.0000 mg | ORAL_TABLET | Freq: Two times a day (BID) | ORAL | 0 refills | Status: AC

## 2020-10-15 NOTE — Progress Notes (Signed)
Patient complains of seeing pink trace on her tissue last night after moving her bowels. Patient reports during lunch today she noticed red traces on the tissue. Patient denies pain at this time. Patient denies having history of hemorrhoids.  Patient has only used eye drops today. Patient has not taken BP medication today.

## 2020-10-15 NOTE — Patient Instructions (Signed)
Please use a stool softener for the next week or until your hemorrhoid has resolved .  Keep rectum area clean and dry, use Preparation H to help shrink  You will take metronidazole for your white discharge.  Drink plenty of water and get some good rest  Please make sure to make your appointment for your follow up colonoscopy.  We will call you with your test results.  Kennieth Rad, PA-C Physician Assistant [facility] [email or web address]    Hemorrhoids Hemorrhoids are swollen veins in and around the rectum or anus. There are two types of hemorrhoids:  Internal hemorrhoids. These occur in the veins that are just inside the rectum. They may poke through to the outside and become irritated and painful.  External hemorrhoids. These occur in the veins that are outside the anus and can be felt as a painful swelling or hard lump near the anus. Most hemorrhoids do not cause serious problems, and they can be managed with home treatments such as diet and lifestyle changes. If home treatments do not help the symptoms, procedures can be done to shrink or remove the hemorrhoids. What are the causes? This condition is caused by increased pressure in the anal area. This pressure may result from various things, including:  Constipation.  Straining to have a bowel movement.  Diarrhea.  Pregnancy.  Obesity.  Sitting for long periods of time.  Heavy lifting or other activity that causes you to strain.  Anal sex.  Riding a bike for a long period of time. What are the signs or symptoms? Symptoms of this condition include:  Pain.  Anal itching or irritation.  Rectal bleeding.  Leakage of stool (feces).  Anal swelling.  One or more lumps around the anus. How is this diagnosed? This condition can often be diagnosed through a visual exam. Other exams or tests may also be done, such as:  An exam that involves feeling the rectal  area with a gloved hand (digital rectal exam).  An exam of the anal canal that is done using a small tube (anoscope).  A blood test, if you have lost a significant amount of blood.  A test to look inside the colon using a flexible tube with a camera on the end (sigmoidoscopy or colonoscopy). How is this treated? This condition can usually be treated at home. However, various procedures may be done if dietary changes, lifestyle changes, and other home treatments do not help your symptoms. These procedures can help make the hemorrhoids smaller or remove them completely. Some of these procedures involve surgery, and others do not. Common procedures include:  Rubber band ligation. Rubber bands are placed at the base of the hemorrhoids to cut off their blood supply.  Sclerotherapy. Medicine is injected into the hemorrhoids to shrink them.  Infrared coagulation. A type of light energy is used to get rid of the hemorrhoids.  Hemorrhoidectomy surgery. The hemorrhoids are surgically removed, and the veins that supply them are tied off.  Stapled hemorrhoidopexy surgery. The surgeon staples the base of the hemorrhoid to the rectal wall. Follow these instructions at home: Eating and drinking   Eat foods that have a lot of fiber in them, such as whole grains, beans, nuts, fruits, and vegetables.  Ask your health care provider about taking products that have added fiber (fiber supplements).  Reduce the amount of fat in your diet. You can do this by eating low-fat dairy products, eating less red meat, and avoiding processed foods.  Drink  enough fluid to keep your urine pale yellow. Managing pain and swelling   Take warm sitz baths for 20 minutes, 3-4 times a day to ease pain and discomfort. You may do this in a bathtub or using a portable sitz bath that fits over the toilet.  If directed, apply ice to the affected area. Using ice packs between sitz baths may be helpful. ? Put ice in a plastic  bag. ? Place a towel between your skin and the bag. ? Leave the ice on for 20 minutes, 2-3 times a day. General instructions  Take over-the-counter and prescription medicines only as told by your health care provider.  Use medicated creams or suppositories as told.  Get regular exercise. Ask your health care provider how much and what kind of exercise is best for you. In general, you should do moderate exercise for at least 30 minutes on most days of the week (150 minutes each week). This can include activities such as walking, biking, or yoga.  Go to the bathroom when you have the urge to have a bowel movement. Do not wait.  Avoid straining to have bowel movements.  Keep the anal area dry and clean. Use wet toilet paper or moist towelettes after a bowel movement.  Do not sit on the toilet for long periods of time. This increases blood pooling and pain.  Keep all follow-up visits as told by your health care provider. This is important. Contact a health care provider if you have:  Increasing pain and swelling that are not controlled by treatment or medicine.  Difficulty having a bowel movement, or you are unable to have a bowel movement.  Pain or inflammation outside the area of the hemorrhoids. Get help right away if you have:  Uncontrolled bleeding from your rectum. Summary  Hemorrhoids are swollen veins in and around the rectum or anus.  Most hemorrhoids can be managed with home treatments such as diet and lifestyle changes.  Taking warm sitz baths can help ease pain and discomfort.  In severe cases, procedures or surgery can be done to shrink or remove the hemorrhoids. This information is not intended to replace advice given to you by your health care provider. Make sure you discuss any questions you have with your health care provider. Document Revised: 05/04/2019 Document Reviewed: 04/27/2018 Elsevier Patient Education  [employer].   Bacterial  Vaginosis  Bacterial vaginosis is a vaginal infection that occurs when the normal balance of bacteria in the vagina is disrupted. It results from an overgrowth of certain bacteria. This is the most common vaginal infection among women ages 39-44. Because bacterial vaginosis increases your risk for STIs (sexually transmitted infections), getting treated can help reduce your risk for chlamydia, gonorrhea, herpes, and HIV (human immunodeficiency virus). Treatment is also important for preventing complications in pregnant women, because this condition can cause an early (premature) delivery. What are the causes? This condition is caused by an increase in harmful bacteria that are normally present in small amounts in the vagina. However, the reason that the condition develops is not fully understood. What increases the risk? The following factors may make you more likely to develop this condition:  Having a new sexual partner or multiple sexual partners.  Having unprotected sex.  Douching.  Having an intrauterine device (IUD).  Smoking.  Drug and alcohol abuse.  Taking certain antibiotic medicines.  Being pregnant. You cannot get bacterial vaginosis from toilet seats, bedding, swimming pools, or contact with objects around you.  What are the signs or symptoms? Symptoms of this condition include:  Grey or white vaginal discharge. The discharge can also be watery or foamy.  A fish-like odor with discharge, especially after sexual intercourse or during menstruation.  Itching in and around the vagina.  Burning or pain with urination. Some women with bacterial vaginosis have no signs or symptoms. How is this diagnosed? This condition is diagnosed based on:  Your medical history.  A physical exam of the vagina.  Testing a sample of vaginal fluid under a microscope to look for a large amount of bad bacteria or abnormal cells. Your health care provider may use a cotton swab or a small  wooden spatula to collect the sample. How is this treated? This condition is treated with antibiotics. These may be given as a pill, a vaginal cream, or a medicine that is put into the vagina (suppository). If the condition comes back after treatment, a second round of antibiotics may be needed. Follow these instructions at home: Medicines  Take over-the-counter and prescription medicines only as told by your health care provider.  Take or use your antibiotic as told by your health care provider. Do not stop taking or using the antibiotic even if you start to feel better. General instructions  If you have a female sexual partner, tell her that you have a vaginal infection. She should see her health care provider and be treated if she has symptoms. If you have a female sexual partner, he does not need treatment.  During treatment: ? Avoid sexual activity until you finish treatment. ? Do not douche. ? Avoid alcohol as directed by your health care provider. ? Avoid breastfeeding as directed by your health care provider.  Drink enough water and fluids to keep your urine clear or pale yellow.  Keep the area around your vagina and rectum clean. ? Wash the area daily with warm water. ? Wipe yourself from front to back after using the toilet.  Keep all follow-up visits as told by your health care provider. This is important. How is this prevented?  Do not douche.  Wash the outside of your vagina with warm water only.  Use protection when having sex. This includes latex condoms and dental dams.  Limit how many sexual partners you have. To help prevent bacterial vaginosis, it is best to have sex with just one partner (monogamous).  Make sure you and your sexual partner are tested for STIs.  Wear cotton or cotton-lined underwear.  Avoid wearing tight pants and pantyhose, especially during summer.  Limit the amount of alcohol that you drink.  Do not use any products that contain  nicotine or tobacco, such as cigarettes and e-cigarettes. If you need help quitting, ask your health care provider.  Do not use illegal drugs. Where to find more information  Centers for Disease Control and Prevention: AppraiserFraud.fi  American Sexual Health Association (ASHA): www.ashastd.org  U.S. Department of Health and Financial controller, Office on Women's Health: DustingSprays.pl or SecuritiesCard.it Contact a health care provider if:  Your symptoms do not improve, even after treatment.  You have more discharge or pain when urinating.  You have a fever.  You have pain in your abdomen.  You have pain during sex.  You have vaginal bleeding between periods. Summary  Bacterial vaginosis is a vaginal infection that occurs when the normal balance of bacteria in the vagina is disrupted.  Because bacterial vaginosis increases your risk for STIs (sexually transmitted infections), getting treated can help  reduce your risk for chlamydia, gonorrhea, herpes, and HIV (human immunodeficiency virus). Treatment is also important for preventing complications in pregnant women, because the condition can cause an early (premature) delivery.  This condition is treated with antibiotic medicines. These may be given as a pill, a vaginal cream, or a medicine that is put into the vagina (suppository). This information is not intended to replace advice given to you by your health care provider. Make sure you discuss any questions you have with your health care provider. Document Revised: 11/18/2017 Document Reviewed: 08/21/2016 Elsevier Patient Education  2020 [employer].

## 2020-10-15 NOTE — Progress Notes (Signed)
Established Patient Office Visit  Subjective:  Patient ID: Amy Combs, female    DOB: 12-Oct-1961  Age: 59 y.o. MRN: [id number]  CC:  Chief Complaint  Patient presents with  . Blood In Stools    HPI Amy Combs reports that she had pink blood on the tissue last night, today she had red blood.  Denies previous issues with hemorrhoids, denies pain, tenderness, constipation, straining to go.  Denies blood in the toilet.  Reports that she started having white thin discharge a few weeks ago, a couple episodes of irritation after urinating, denies odor, fever, abdominal pain, back pain, pruritus.  Reports that she started Azo a few days ago without relief  Significant history of recent colonoscopy with torturous colon, is due to follow-up for 58-month follow-up colonoscopy.      Past Medical History:  Diagnosis Date  . Allergy   . Ankle swelling   . Asthma   . GERD (gastroesophageal reflux disease)   . Hypertension   . Primary open angle glaucoma (POAG) of right eye, severe stage     Past Surgical History:  Procedure Laterality Date  . ABDOMINAL SURGERY     mesh placed for scar tissue  . CESAREAN SECTION CLASSICAL    . COLONOSCOPY  06/06/2020  . TUBAL LIGATION      Family History  Problem Relation Age of Onset  . High blood pressure Mother   . Cataracts Mother   . High blood pressure Father   . Hearing loss Sister   . Cataracts Maternal Grandfather   . Glaucoma Maternal Grandfather   . Breast cancer Paternal Aunt   . Colon cancer Neg Hx   . Esophageal cancer Neg Hx   . Rectal cancer Neg Hx   . Stomach cancer Neg Hx     Social History   Socioeconomic History  . Marital status: Single    Spouse name: Not on file  . Number of children: Not on file  . Years of education: Not on file  . Highest education level: Not on file  Occupational History  . Not on file  Tobacco Use  . Smoking status: Never Smoker  . Smokeless tobacco: Never Used  Vaping  Use  . Vaping Use: Never used  Substance and Sexual Activity  . Alcohol use: Yes    Comment: occasional  . Drug use: Not Currently    Types: Marijuana  . Sexual activity: Not on file  Other Topics Concern  . Not on file  Social History Narrative  . Not on file   Social Determinants of Health   Financial Resource Strain:   . Difficulty of Paying Living Expenses: Not on file  Food Insecurity:   . Worried About Charity fundraiser in the Last Year: Not on file  . Ran Out of Food in the Last Year: Not on file  Transportation Needs:   . Lack of Transportation (Medical): Not on file  . Lack of Transportation (Non-Medical): Not on file  Physical Activity:   . Days of Exercise per Week: Not on file  . Minutes of Exercise per Session: Not on file  Stress:   . Feeling of Stress : Not on file  Social Connections:   . Frequency of Communication with Friends and Family: Not on file  . Frequency of Social Gatherings with Friends and Family: Not on file  . Attends Religious Services: Not on file  . Active Member of Clubs or Organizations: Not on file  .  Attends Archivist Meetings: Not on file  . Marital Status: Not on file  Intimate Partner Violence:   . Fear of Current or Ex-Partner: Not on file  . Emotionally Abused: Not on file  . Physically Abused: Not on file  . Sexually Abused: Not on file    Outpatient Medications Prior to Visit  Medication Sig Dispense Refill  . albuterol (PROVENTIL) (2.5 MG/3ML) 0.083% nebulizer solution Take 3 mLs (2.5 mg total) by nebulization every 4 (four) hours as needed. 180 mL 1  . amLODipine (NORVASC) 10 MG tablet Take 1 tablet (10 mg total) by mouth daily. Dx Code: I10 90 tablet 1  . beclomethasone (QVAR) 80 MCG/ACT inhaler Inhale 2 puffs into the lungs 2 (two) times daily. Uses PRN    . budesonide-formoterol (SYMBICORT) 160-4.5 MCG/ACT inhaler TAKE 2 PUFFS BY MOUTH TWICE A DAY 10.2 g 5  . chlorthalidone (HYGROTON) 25 MG tablet Take 1  tablet (25 mg total) by mouth daily. Dx Code: I10 90 tablet 1  . famotidine (PEPCID) 40 MG tablet TAKE 1 TABLET BY MOUTH EVERY DAY 90 tablet 0  . latanoprost (XALATAN) 0.005 % ophthalmic solution     . Multiple Vitamins-Minerals (ONE DAILY FOR WOMEN PO) Take by mouth daily.    . pantoprazole (PROTONIX) 40 MG tablet TAKE 1 TABLET BY MOUTH EVERY DAY 90 tablet 1  . SIMBRINZA 1-0.2 % SUSP Place 1 drop into the right eye 2 (two) times daily.    Marland Kitchen telmisartan (MICARDIS) 40 MG tablet Take 1 tablet (40 mg total) by mouth daily. Dx Code: I10 90 tablet 1  . vitamin B-12 (CYANOCOBALAMIN) 500 MCG tablet Take 500 mcg by mouth daily.    . valACYclovir (VALTREX) 1000 MG tablet Take 1 tablet (1,000 mg total) by mouth 2 (two) times daily. (Patient not taking: Reported on 10/15/2020) 30 tablet 2   No facility-administered medications prior to visit.    Allergies  Allergen Reactions  . Other     Other reaction(s): Eye Redness Dust, ragweed, grass  . Lisinopril Swelling    ROS Review of Systems  Constitutional: Negative for chills, fatigue and fever.  HENT: Negative.   Eyes: Negative.   Respiratory: Negative.   Cardiovascular: Negative.   Gastrointestinal: Negative for abdominal pain, constipation, diarrhea, nausea and vomiting.  Endocrine: Negative.   Genitourinary: Positive for dysuria. Negative for flank pain, frequency, genital sores, hematuria, pelvic pain and urgency.  Musculoskeletal: Negative for back pain.  Skin: Negative.   Allergic/Immunologic: Negative.   Neurological: Negative.   Hematological: Negative.   Psychiatric/Behavioral: Negative.       Objective:    Physical Exam Vitals and nursing note reviewed.  Constitutional:      General: She is not in acute distress.    Appearance: Normal appearance. She is not ill-appearing.  HENT:     Head: Normocephalic and atraumatic.     Right Ear: External ear normal.     Left Ear: External ear normal.     Nose: Nose normal.      Mouth/Throat:     Mouth: Mucous membranes are moist.     Pharynx: Oropharynx is clear.  Eyes:     Extraocular Movements: Extraocular movements intact.     Conjunctiva/sclera: Conjunctivae normal.     Pupils: Pupils are equal, round, and reactive to light.  Cardiovascular:     Rate and Rhythm: Normal rate and regular rhythm.     Pulses: Normal pulses.  Pulmonary:     Effort: Pulmonary effort  is normal.     Breath sounds: Normal breath sounds.  Abdominal:     General: Abdomen is flat.     Palpations: Abdomen is soft.     Tenderness: There is no abdominal tenderness. There is no right CVA tenderness or left CVA tenderness.  Genitourinary:    Rectum: External hemorrhoid present.    Musculoskeletal:        General: Normal range of motion.     Cervical back: Normal range of motion and neck supple.  Skin:    General: Skin is warm and dry.  Neurological:     General: No focal deficit present.     Mental Status: She is alert and oriented to person, place, and time.  Psychiatric:        Mood and Affect: Mood normal.        Behavior: Behavior normal.        Thought Content: Thought content normal.        Judgment: Judgment normal.     BP (!) 156/86 (BP Location: Left Arm, Patient Position: Sitting, Cuff Size: Large)   Pulse 74   Temp 98.1 F (36.7 C) (Oral)   Resp 18   Ht 5\' 9"  (1.753 m)   Wt 236 lb (107 kg)   LMP 02/27/2016   SpO2 95%   BMI 34.85 kg/m  Wt Readings from Last 3 Encounters:  10/15/20 236 lb (107 kg)  06/06/20 239 lb 6.4 oz (108.6 kg)  05/28/20 239 lb 6.4 oz (108.6 kg)     Health Maintenance Due  Topic Date Due  . COVID-19 Vaccine (1) Never done    There are no preventive care reminders to display for this patient.  No results found for: TSH Lab Results  Component Value Date   WBC 6.5 02/15/2020   HGB 13.2 02/15/2020   HCT 39.2 02/15/2020   MCV 93 02/15/2020   PLT 305 02/15/2020   Lab Results  Component Value Date   NA 140 02/15/2020   K  3.9 02/15/2020   CO2 23 02/15/2020   GLUCOSE 100 (H) 02/15/2020   BUN 14 02/15/2020   CREATININE 1.02 (H) 02/15/2020   BILITOT 0.4 02/15/2020   ALKPHOS 100 02/15/2020   AST 15 02/15/2020   ALT 15 02/15/2020   PROT 8.0 02/15/2020   ALBUMIN 4.4 02/15/2020   CALCIUM 9.7 02/15/2020   Lab Results  Component Value Date   CHOL 190 02/15/2020   Lab Results  Component Value Date   HDL 61 02/15/2020   Lab Results  Component Value Date   LDLCALC 113 (H) 02/15/2020   Lab Results  Component Value Date   TRIG 87 02/15/2020   Lab Results  Component Value Date   CHOLHDL 3.1 02/15/2020   No results found for: HGBA1C    Assessment & Plan:   Problem List Items Addressed This Visit    None    Visit Diagnoses    Bacterial vaginitis    -  Primary   Relevant Medications   metroNIDAZOLE (FLAGYL) 500 MG tablet   Other Relevant Orders   POCT URINALYSIS DIP (CLINITEK) (Completed)   Urine cytology ancillary only   Bleeding hemorrhoid        1. Bacterial vaginitis Patient education given - metroNIDAZOLE (FLAGYL) 500 MG tablet; Take 1 tablet (500 mg total) by mouth 2 (two) times daily for 7 days.  Dispense: 14 tablet; Refill: 0 - POCT URINALYSIS DIP (CLINITEK) - Urine cytology ancillary only  2. Bleeding hemorrhoid Patient  education given, use over-the-counter stool softeners, Preparation H, keep area dry and clean.  Patient encouraged to make follow-up appointment with gastroenterology for follow-up colonoscopy   Meds ordered this encounter  Medications  . metroNIDAZOLE (FLAGYL) 500 MG tablet    Sig: Take 1 tablet (500 mg total) by mouth 2 (two) times daily for 7 days.    Dispense:  14 tablet    Refill:  0    Order Specific Question:   Supervising Provider    Answer:   Elsie Stain [[id number]]    I have reviewed the patient's medical history (PMH, PSH, Social History, Family History, Medications, and allergies) , and have been updated if relevant. I spent 30 minutes  reviewing chart and  face to face time with patient.    Follow-up: No follow-ups on file.    Loraine Grip Mayers, PA-C

## 2020-10-17 LAB — URINE CYTOLOGY ANCILLARY ONLY
Bacterial Vaginitis (gardnerella): POSITIVE — AB
Candida Glabrata: NEGATIVE
Candida Vaginitis: NEGATIVE
Chlamydia: NEGATIVE
Comment: NEGATIVE
Comment: NEGATIVE
Comment: NEGATIVE
Comment: NEGATIVE
Comment: NEGATIVE
Comment: NORMAL
Neisseria Gonorrhea: NEGATIVE
Trichomonas: POSITIVE — AB

## 2020-10-20 ENCOUNTER — Other Ambulatory Visit: Payer: Self-pay

## 2020-10-20 ENCOUNTER — Telehealth: Payer: Self-pay | Admitting: Physician Assistant

## 2020-10-20 ENCOUNTER — Telehealth: Payer: Self-pay | Admitting: *Deleted

## 2020-10-20 MED ORDER — BUDESONIDE-FORMOTEROL FUMARATE 160-4.5 MCG/ACT IN AERO: INHALATION_SPRAY | RESPIRATORY_TRACT | 1 refills | Status: DC

## 2020-10-20 NOTE — Telephone Encounter (Signed)
-----   Message from Kennieth Rad, [place] sent at 10/20/2020 11:43 AM EDT ----- Please call patient and let her know that she was positive for BV and trich, she should be taking metronidazole twice daily ,which is the treatment for both. She does need to notify any partners and let them know that they need to be tested/treated for trich.  She should be retested for trich two weeks after she is done with treatment to make sure infection is cleared.  She should abstain from intercourse until treatment is completed and sxs have resolved.

## 2020-10-20 NOTE — Telephone Encounter (Signed)
Patient verified DOB Patient is aware of being positive for BV and [place]. Patient is aware of completing the flagyl treatment of 7 days and inform any partners for needing to be tested and treated. Patient had no other questions.

## 2020-10-20 NOTE — Telephone Encounter (Signed)
Medical Assistant left message on patient's home and cell voicemail. Voicemail states to give a call back to Sanjuan Sawa with [facility] at [phone]. 

## 2020-10-22 ENCOUNTER — Other Ambulatory Visit: Payer: No Typology Code available for payment source

## 2020-10-22 ENCOUNTER — Other Ambulatory Visit: Payer: Self-pay

## 2020-10-22 DIAGNOSIS — A539 Syphilis, unspecified: Secondary | ICD-10-CM

## 2020-10-23 LAB — RPR, QUANT+TP ABS (REFLEX)
Rapid Plasma Reagin, Quant: 1:1 {titer} — ABNORMAL HIGH
T Pallidum Abs: REACTIVE — AB

## 2020-10-23 LAB — RPR: RPR Ser Ql: REACTIVE — AB

## 2020-10-29 ENCOUNTER — Telehealth: Payer: Self-pay

## 2020-10-29 NOTE — Telephone Encounter (Signed)
Patient contacted the office to discuss lab results.

## 2020-11-05 ENCOUNTER — Telehealth: Payer: Self-pay

## 2020-11-05 NOTE — Telephone Encounter (Signed)
FYI-  Pt returned call. Pt request to wait until after the holidays to schedule Colon +EMR. Pt would also like to come in and discuss scheduling. Pt has been scheduled for ROV with Dr. Rush Landmark on 01/06/21 @ 8:30am.

## 2020-11-05 NOTE — Telephone Encounter (Signed)
Called and left message asking for return call from pt. Need to schedule ROV or just schedule Colon +EMR at hospital with Dr. Rush Landmark.

## 2020-11-05 NOTE — Telephone Encounter (Signed)
-----   Message from Doran Stabler, MD sent at 11/04/2020  5:37 AM EST ----- Regarding: FW: Follow-up colonoscopy Vivien Rota,    Is this patient on the December recall list?  Needs a letter to schedule a colonoscopy at hospital with GM.  - HD ----- Message ----- From: Irving Copas., MD Sent: 11/02/2020   1:05 PM EST To: Timothy Lasso, RN, Doran Stabler, MD Subject: Follow-up colonoscopy                          Patty,I wanted to follow-up on this patient that Mallie Mussel had asked [place] to try and do in the hospital-based setting as a result of an incomplete colonoscopy.I do not see any follow-up notation other than a letter saying that we will reach them out by November but I had not seen anything else.You have her on our list to reach out to you and schedule for colonoscopy with ultraslim colonoscope?Please let us know what you find.Thanks.GM

## 2020-11-05 NOTE — Telephone Encounter (Signed)
Thanks for update. 75 minute procedure is OK. GM

## 2020-11-06 ENCOUNTER — Other Ambulatory Visit: Payer: No Typology Code available for payment source

## 2020-11-06 NOTE — Progress Notes (Signed)
Patient notified of results & recommendations. Expressed understanding. Put in a recall to have labs repeated in 04/2021

## 2020-11-10 ENCOUNTER — Other Ambulatory Visit (HOSPITAL_COMMUNITY)
Admission: RE | Admit: 2020-11-10 | Discharge: 2020-11-10 | Disposition: A | Discharge status: Home / Self Care | Payer: No Typology Code available for payment source | Source: Ambulatory Visit | Attending: Internal Medicine | Admitting: Internal Medicine

## 2020-11-10 ENCOUNTER — Other Ambulatory Visit: Payer: Self-pay

## 2020-11-10 ENCOUNTER — Other Ambulatory Visit (INDEPENDENT_AMBULATORY_CARE_PROVIDER_SITE_OTHER): Payer: No Typology Code available for payment source

## 2020-11-10 DIAGNOSIS — N760 Acute vaginitis: Secondary | ICD-10-CM | POA: Insufficient documentation

## 2020-11-10 DIAGNOSIS — Z8619 Personal history of other infectious and parasitic diseases: Secondary | ICD-10-CM

## 2020-11-10 DIAGNOSIS — B9689 Other specified bacterial agents as the cause of diseases classified elsewhere: Secondary | ICD-10-CM | POA: Diagnosis not present

## 2020-11-10 NOTE — Progress Notes (Signed)
Patient here for reswab after positive test for bacterial vaginitis & trichomonas. KWalker, CMA.

## 2020-11-11 LAB — CERVICOVAGINAL ANCILLARY ONLY
Bacterial Vaginitis (gardnerella): POSITIVE — AB
Candida Glabrata: NEGATIVE
Candida Vaginitis: NEGATIVE
Chlamydia: NEGATIVE
Comment: NEGATIVE
Comment: NEGATIVE
Comment: NEGATIVE
Comment: NEGATIVE
Comment: NEGATIVE
Comment: NORMAL
Neisseria Gonorrhea: NEGATIVE
Trichomonas: NEGATIVE

## 2020-11-12 ENCOUNTER — Other Ambulatory Visit: Payer: Self-pay | Admitting: Internal Medicine

## 2020-11-12 MED ORDER — METRONIDAZOLE 500 MG PO TABS: 500.0000 mg | ORAL_TABLET | Freq: Two times a day (BID) | ORAL | 0 refills | Status: DC

## 2020-11-12 NOTE — Progress Notes (Signed)
Patient notified of results & recommendations. Expressed understanding.

## 2020-12-16 ENCOUNTER — Telehealth: Payer: Self-pay | Admitting: Internal Medicine

## 2020-12-16 NOTE — Telephone Encounter (Signed)
Patient called in requesting refills for val

## 2020-12-16 NOTE — Telephone Encounter (Signed)
Patient called in requesting refill for Valtrex. Accidentally signed encounter before finishing and routing.   Please follow up.

## 2020-12-17 NOTE — Telephone Encounter (Signed)
Patient called and needs a 90 days supply of the Valtrex due to insurance purposes. Please follow up.

## 2020-12-18 ENCOUNTER — Other Ambulatory Visit: Payer: Self-pay | Admitting: Internal Medicine

## 2020-12-18 DIAGNOSIS — R21 Rash and other nonspecific skin eruption: Secondary | ICD-10-CM

## 2020-12-18 MED ORDER — VALACYCLOVIR HCL 1 G PO TABS: 1000.0000 mg | ORAL_TABLET | Freq: Two times a day (BID) | ORAL | 0 refills | Status: DC

## 2020-12-18 NOTE — Telephone Encounter (Signed)
Rx last written on 04/24/20 with 2 refills. Can patient receive 90 day supply for insurance purposes? If so please send to patient preferred pharmacy.

## 2020-12-18 NOTE — Telephone Encounter (Signed)
90 day supply sent in.   Marcy Siren, D.O. Primary Care at [facility]  12/18/2020, 1:48 PM

## 2020-12-29 ENCOUNTER — Other Ambulatory Visit: Payer: Self-pay | Admitting: Internal Medicine

## 2020-12-29 DIAGNOSIS — Z1231 Encounter for screening mammogram for malignant neoplasm of breast: Secondary | ICD-10-CM

## 2021-01-06 ENCOUNTER — Ambulatory Visit: Payer: No Typology Code available for payment source | Admitting: Gastroenterology

## 2021-01-09 ENCOUNTER — Other Ambulatory Visit: Payer: Self-pay | Admitting: Internal Medicine

## 2021-01-09 DIAGNOSIS — R21 Rash and other nonspecific skin eruption: Secondary | ICD-10-CM

## 2021-02-09 ENCOUNTER — Inpatient Hospital Stay: Admission: RE | Admit: 2021-02-09 | Payer: No Typology Code available for payment source | Source: Ambulatory Visit

## 2021-02-13 ENCOUNTER — Encounter: Payer: Self-pay | Admitting: Gastroenterology

## 2021-02-13 ENCOUNTER — Ambulatory Visit (INDEPENDENT_AMBULATORY_CARE_PROVIDER_SITE_OTHER): Payer: No Typology Code available for payment source | Admitting: Gastroenterology

## 2021-02-13 VITALS — BP 122/70 | HR 72 | Ht 69.0 in | Wt 237.2 lb

## 2021-02-13 DIAGNOSIS — K219 Gastro-esophageal reflux disease without esophagitis: Secondary | ICD-10-CM

## 2021-02-13 DIAGNOSIS — Z86010 Personal history of colonic polyps: Secondary | ICD-10-CM | POA: Diagnosis not present

## 2021-02-13 DIAGNOSIS — R933 Abnormal findings on diagnostic imaging of other parts of digestive tract: Secondary | ICD-10-CM

## 2021-02-13 MED ORDER — PLENVU 140 G PO SOLR: 1.0000 | ORAL | 0 refills | Status: DC

## 2021-02-13 NOTE — Patient Instructions (Signed)
You have been scheduled for a colonoscopy. Please follow written instructions given to you at your visit today.  Please pick up your prep supplies at the pharmacy within the next 1-3 days. If you use inhalers (even only as needed), please bring them with you on the day of your procedure.  We have sent the following medications to your pharmacy for you to pick up at your convenience: Plenvu  If you are age 59 or younger, your body mass index should be between 19-25. Your Body mass index is 35.03 kg/m. If this is out of the aformentioned range listed, please consider follow up with your Primary Care Provider.   Due to recent changes in healthcare laws, you may see the results of your imaging and laboratory studies on MyChart before your provider has had a chance to review them.  We understand that in some cases there may be results that are confusing or concerning to you. Not all laboratory results come back in the same time frame and the provider may be waiting for multiple results in order to interpret others.  Please give [place] 48 hours in order for your provider to thoroughly review all the results before contacting the office for clarification of your results.   Thank you for choosing me and [facility].  Dr. Rush Landmark

## 2021-02-17 ENCOUNTER — Encounter: Payer: Self-pay | Admitting: Gastroenterology

## 2021-02-17 DIAGNOSIS — R933 Abnormal findings on diagnostic imaging of other parts of digestive tract: Secondary | ICD-10-CM | POA: Insufficient documentation

## 2021-02-17 DIAGNOSIS — Z86010 Personal history of colonic polyps: Secondary | ICD-10-CM | POA: Insufficient documentation

## 2021-02-17 DIAGNOSIS — K219 Gastro-esophageal reflux disease without esophagitis: Secondary | ICD-10-CM | POA: Insufficient documentation

## 2021-02-17 NOTE — Progress Notes (Signed)
[place] VISIT   Primary Care Provider Nicolette Bang, DO [place] [address] [phone]  Referring Provider Dr. Loletha Carrow  Patient Profile: Amy Combs is a 60 y.o. female with a pmh significant for hypertension, asthma, glaucoma, allergies, status post abdominal mesh for hernia, GERD, colon polyp (TA).  The patient presents to the [facility] for an evaluation and management of problem(s) noted below:  Problem List 1. Incomplete colonoscopy   2. History of adenomatous polyp of colon   3. Gastroesophageal reflux disease, unspecified whether esophagitis present     History of Present Illness This is a patient that underwent a screening colonoscopy attempt in June 2021 with Dr. Loletha Carrow.  The majority of the colon was visualized but due to significant redundancy, completion of the colonoscopy was not possible to evaluate the entirety of the right colon and cecum.  The patient and Dr. Loletha Carrow discussed potential follow-up/repeat colonoscopy attempt and is for this reason that I am meeting the patient for consideration of follow-up.  This was the patient's first screening colonoscopy and she was found to have an adenoma.  Patient denies any other significant GI complaints from a colon perspective.  She has longstanding GERD symptoms but denies overt dysphagia or odynophagia.  She takes her PPI daily and by doing so she is able to maintain her heartburn in good control.  The patient has never had an upper endoscopy.  GI Review of Systems Positive as above Negative for pain, nausea, vomiting, change in bowel habits, melena, hematochezia  Review of Systems General: Denies fevers/chills/weight loss unintentionally Cardiovascular: Denies chest pain/palpitations Pulmonary: Denies shortness of breath Gastroenterological: See HPI Genitourinary: Denies darkened urine Hematological: Denies easy  bruising/bleeding Dermatological: Denies jaundice Psychological: Mood is stable   Medications Current Outpatient Medications  Medication Sig Dispense Refill  . albuterol (PROVENTIL) (2.5 MG/3ML) 0.083% nebulizer solution Take 3 mLs (2.5 mg total) by nebulization every 4 (four) hours as needed. 180 mL 1  . amLODipine (NORVASC) 10 MG tablet Take 1 tablet (10 mg total) by mouth daily. Dx Code: I10 90 tablet 1  . beclomethasone (QVAR) 80 MCG/ACT inhaler Inhale 2 puffs into the lungs 2 (two) times daily. Uses PRN    . budesonide-formoterol (SYMBICORT) 160-4.5 MCG/ACT inhaler TAKE 2 PUFFS BY MOUTH TWICE A DAY 30.6 g 1  . chlorthalidone (HYGROTON) 25 MG tablet Take 1 tablet (25 mg total) by mouth daily. Dx Code: I10 90 tablet 1  . famotidine (PEPCID) 40 MG tablet TAKE 1 TABLET BY MOUTH EVERY DAY 90 tablet 0  . latanoprost (XALATAN) 0.005 % ophthalmic solution     . Multiple Vitamins-Minerals (ONE DAILY FOR WOMEN PO) Take by mouth daily.    . pantoprazole (PROTONIX) 40 MG tablet TAKE 1 TABLET BY MOUTH EVERY DAY 90 tablet 1  . SIMBRINZA 1-0.2 % SUSP Place 1 drop into the right eye 2 (two) times daily.    Marland Kitchen telmisartan (MICARDIS) 40 MG tablet Take 1 tablet (40 mg total) by mouth daily. Dx Code: I10 90 tablet 1  . valACYclovir (VALTREX) 1000 MG tablet TAKE 1 TABLET BY MOUTH TWICE A DAY 60 tablet 1  . vitamin B-12 (CYANOCOBALAMIN) 500 MCG tablet Take 500 mcg by mouth daily.     No current facility-administered medications for this visit.    Allergies Allergies  Allergen Reactions  . Other     Other reaction(s): Eye Redness Dust, ragweed, grass  . Lisinopril Swelling    Histories Past Medical  History:  Diagnosis Date  . Allergy   . Ankle swelling   . Asthma   . GERD (gastroesophageal reflux disease)   . Hypertension   . Primary open angle glaucoma (POAG) of right eye, severe stage    Past Surgical History:  Procedure Laterality Date  . ABDOMINAL SURGERY     mesh placed for scar  tissue  . CESAREAN SECTION CLASSICAL    . COLONOSCOPY  06/06/2020  . TUBAL LIGATION     Social History   Socioeconomic History  . Marital status: Single    Spouse name: Not on file  . Number of children: Not on file  . Years of education: Not on file  . Highest education level: Not on file  Occupational History  . Not on file  Tobacco Use  . Smoking status: Never Smoker  . Smokeless tobacco: Never Used  Vaping Use  . Vaping Use: Never used  Substance and Sexual Activity  . Alcohol use: Yes    Comment: occasional  . Drug use: Not Currently    Types: Marijuana  . Sexual activity: Not on file  Other Topics Concern  . Not on file  Social History Narrative  . Not on file   Social Determinants of Health   Financial Resource Strain: Not on file  Food Insecurity: Not on file  Transportation Needs: Not on file  Physical Activity: Not on file  Stress: Not on file  Social Connections: Not on file  Intimate Partner Violence: Not on file   Family History  Problem Relation Age of Onset  . High blood pressure Mother   . Cataracts Mother   . Glaucoma Mother   . High blood pressure Father   . Cataracts Maternal Grandfather   . Glaucoma Maternal Grandfather   . Breast cancer Paternal Aunt   . Colon cancer Neg Hx   . Esophageal cancer Neg Hx   . Rectal cancer Neg Hx   . Stomach cancer Neg Hx   . Inflammatory bowel disease Neg Hx   . Liver disease Neg Hx   . Pancreatic cancer Neg Hx    I have reviewed her medical, social, and family history in detail and updated the electronic medical record as necessary.    PHYSICAL EXAMINATION  BP 122/70   Pulse 72   Ht 5\' 9"  (1.753 m)   Wt 237 lb 3.2 oz (107.6 kg)   LMP 02/27/2016   BMI 35.03 kg/m  Wt Readings from Last 3 Encounters:  02/13/21 237 lb 3.2 oz (107.6 kg)  10/15/20 236 lb (107 kg)  06/06/20 239 lb 6.4 oz (108.6 kg)  GEN: NAD, appears stated age, doesn't appear chronically ill PSYCH: Cooperative, without pressured  speech EYE: Conjunctivae pink, sclerae anicteric ENT: Masked CV: Nontachycardic RESP: No audible wheezing GI: NABS, soft, ventral diastases noted, NT/ND, without rebound or guarding, no HSM appreciated MSK/EXT: No lower extremity edema SKIN: No jaundice NEURO:  Alert & Oriented x 3, no focal deficits   REVIEW OF DATA  I reviewed the following data at the time of this encounter:  GI Procedures and Studies  June 2021 colonoscopy - One 5 mm polyp in the mid ascending colon, removed with a cold snare. Resected and retrieved. - Diverticulosis in the left colon. - Tortuous colon. - Redundant colon. - The examination was otherwise normal on direct and retroflexion views. Pathology Diagnosis Surgical [P], colon, ascending, polyp - TUBULAR ADENOMA - NEGATIVE FOR HIGH-GRADE DYSPLASIA OR [facility]  Laboratory Studies  Reviewed  those in epic  Imaging Studies  No relevant studies to review   ASSESSMENT  Ms. Dufresne is a 60 y.o. female with a pmh significant for hypertension, asthma, glaucoma, allergies, status post abdominal mesh for hernia, GERD, colon polyp (TA).  The patient is seen today for evaluation and management of:  1. Incomplete colonoscopy   2. History of adenomatous polyp of colon   3. Gastroesophageal reflux disease, unspecified whether esophagitis present    The patient is clinically and hemodynamically stable.  The patient now has a history of adenomatous colon polyp as well as an incomplete colonoscopic evaluation.  As such, she is a higher risk individual.  Repeat colonoscopy attempt is necessary at this point.  Use of minimal water exchange first water exchange colonoscopy with potential use of small bowel enteroscope if necessary due to length and redundancy is possible in the hospital-based setting.  As well, with use of CO2 rather than O2, we may have better success at completion of the colonoscopy.  We will move forward with scheduling this at the patient's  convenience in the coming months.  The risks and benefits of endoscopic evaluation were discussed with the patient; these include but are not limited to the risk of perforation, infection, bleeding, missed lesions, lack of diagnosis, severe illness requiring hospitalization, as well as anesthesia and sedation related illnesses.  The patient is agreeable to proceed.  All patient questions were answered to the best of my ability, and the patient agrees to the aforementioned plan of action with follow-up as indicated.   PLAN  Proceed with scheduling colonoscopy in hospital-based setting -Need for CO2 as well as small bowel enteroscope Follow-up colonoscopy timing/surveillance we based on number of other polyps that are found if any   Orders Placed This Encounter  Procedures  . Procedural/ Surgical Case Request: COLONOSCOPY WITH PROPOFOL, ENDOSCOPIC MUCOSAL RESECTION  . Ambulatory referral to Gastroenterology    New Prescriptions   No medications on file   Modified Medications   No medications on file    Planned Follow Up No follow-ups on file.   Total Time in Face-to-Face and in Coordination of Care for patient including independent/personal interpretation/review of prior testing, medical history, examination, medication adjustment, communicating results with the patient directly, and documentation with the EHR is 25 minutes.  Justice Britain, MD [facility] Office # [phone]

## 2021-02-23 ENCOUNTER — Telehealth: Payer: Self-pay

## 2021-02-23 DIAGNOSIS — R933 Abnormal findings on diagnostic imaging of other parts of digestive tract: Secondary | ICD-10-CM

## 2021-02-23 DIAGNOSIS — K219 Gastro-esophageal reflux disease without esophagitis: Secondary | ICD-10-CM

## 2021-02-23 DIAGNOSIS — Z1211 Encounter for screening for malignant neoplasm of colon: Secondary | ICD-10-CM

## 2021-02-23 DIAGNOSIS — Z86010 Personal history of colonic polyps: Secondary | ICD-10-CM

## 2021-02-23 NOTE — Telephone Encounter (Signed)
-----   Message from Irving Copas., MD sent at 02/17/2021  4:18 AM EST ----- Regarding: Follow-up Amy Combs, This patient needs to have both a colonoscopy and endoscopy done due to her history of GERD and for Barrett's screening.  No worries about adding any additional time to the current time slot that she has, but I want to make sure that we get preauthorization for the endoscopy.  Thanks. GM

## 2021-02-23 NOTE — Telephone Encounter (Signed)
EGD has been added to 04/13/21 procedures at [facility]. Additional dx codes have been added. New amb ref sent to [facility] for PA to be obtain.

## 2021-02-24 ENCOUNTER — Telehealth: Payer: Self-pay | Admitting: Allergy and Immunology

## 2021-02-24 MED ORDER — FAMOTIDINE 40 MG PO TABS: 40.0000 mg | ORAL_TABLET | Freq: Every day | ORAL | 0 refills | Status: DC

## 2021-02-24 NOTE — Telephone Encounter (Signed)
Duplicate message, call has been addressed...look at previous telephone contact.

## 2021-02-24 NOTE — Telephone Encounter (Signed)
Patient was returning a nurse call about the pharmacy that she using [facility] [address] same one she has always used.

## 2021-02-24 NOTE — Telephone Encounter (Signed)
Pt would like refill for famotidine.   Please advise.

## 2021-02-24 NOTE — Addendum Note (Signed)
Addended by: Isabel Caprice on: 02/24/2021 02:18 PM   Modules accepted: Orders

## 2021-02-24 NOTE — Telephone Encounter (Signed)
Left patient a message to call our office back so that we maybe able to verify her pharmacy.

## 2021-02-24 NOTE — Telephone Encounter (Signed)
Pt called to verify pharmacy.   A refill has been sent into the pharmacy for Famotidine.

## 2021-03-08 ENCOUNTER — Other Ambulatory Visit: Payer: Self-pay | Admitting: Internal Medicine

## 2021-03-08 DIAGNOSIS — R21 Rash and other nonspecific skin eruption: Secondary | ICD-10-CM

## 2021-03-24 ENCOUNTER — Ambulatory Visit: Payer: No Typology Code available for payment source | Admitting: Allergy and Immunology

## 2021-03-30 ENCOUNTER — Inpatient Hospital Stay: Admission: RE | Admit: 2021-03-30 | Payer: No Typology Code available for payment source | Source: Ambulatory Visit

## 2021-04-07 ENCOUNTER — Telehealth: Payer: Self-pay | Admitting: Gastroenterology

## 2021-04-07 NOTE — Telephone Encounter (Signed)
Inbound call from patient. Have procedure at hospital 4/25. Patient needs to reschedule due to family issues. Best contact number [phone]

## 2021-04-08 NOTE — Telephone Encounter (Signed)
Tried to return pt call to get her r/s for hospital procedure. Had to leave message.

## 2021-04-08 NOTE — Progress Notes (Signed)
Attempted to obtain medical history via telephone, unable to reach at this time. I left a voicemail to return pre surgical testing department's phone call.  

## 2021-04-09 ENCOUNTER — Other Ambulatory Visit (HOSPITAL_COMMUNITY): Payer: No Typology Code available for payment source

## 2021-04-09 NOTE — Telephone Encounter (Signed)
FYI-  Returned call to pt. Pt did not want to r/s at this time. Pt states that she wanted to wait until the of the summer. Case was cxd by [facility] in scheduling.

## 2021-04-10 NOTE — Telephone Encounter (Signed)
Recall in for 3 months.

## 2021-04-13 ENCOUNTER — Ambulatory Visit (HOSPITAL_COMMUNITY)
Admission: RE | Admit: 2021-04-13 | Payer: No Typology Code available for payment source | Source: Home / Self Care | Admitting: Gastroenterology

## 2021-04-13 ENCOUNTER — Encounter (HOSPITAL_COMMUNITY): Admission: RE | Payer: Self-pay | Source: Home / Self Care

## 2021-04-13 SURGERY — COLONOSCOPY WITH PROPOFOL
Anesthesia: Monitor Anesthesia Care

## 2021-05-06 ENCOUNTER — Other Ambulatory Visit: Payer: Self-pay | Admitting: Allergy and Immunology

## 2021-05-09 ENCOUNTER — Other Ambulatory Visit: Payer: Self-pay | Admitting: Internal Medicine

## 2021-05-09 DIAGNOSIS — R21 Rash and other nonspecific skin eruption: Secondary | ICD-10-CM

## 2021-06-26 ENCOUNTER — Other Ambulatory Visit: Payer: Self-pay | Admitting: Allergy and Immunology

## 2021-09-12 ENCOUNTER — Other Ambulatory Visit: Payer: Self-pay | Admitting: Allergy and Immunology

## 2021-09-14 ENCOUNTER — Other Ambulatory Visit: Payer: Self-pay | Admitting: *Deleted

## 2021-09-14 ENCOUNTER — Telehealth: Payer: Self-pay | Admitting: Allergy and Immunology

## 2021-09-14 MED ORDER — SYMBICORT 160-4.5 MCG/ACT IN AERO: INHALATION_SPRAY | RESPIRATORY_TRACT | 0 refills | Status: DC

## 2021-09-14 NOTE — Telephone Encounter (Signed)
Courtesy refill for 3 months supply has been sent in. Called and left a voicemail for the patient asking for a return call to inform.

## 2021-09-14 NOTE — Telephone Encounter (Signed)
Patient last seen 09/23/20. She called to make an appointment with Dr. Neldon Mc, but he is booked up until December. I scheduled her with Webb Silversmith on 10/23/21. She will be out of Symbicort soon. She said if she gets a refill for 1 it is $40, but if she gets a 3 month supply, it is only $80; so she is requesting a 3 month supply and I did tell her she would have to keep this appointment.  [facility].

## 2021-09-14 NOTE — Telephone Encounter (Signed)
Patient has been informed of Ashleigh's Message.  Patient states she will keep her appt for 10/23/21.

## 2021-09-24 ENCOUNTER — Ambulatory Visit (INDEPENDENT_AMBULATORY_CARE_PROVIDER_SITE_OTHER): Payer: No Typology Code available for payment source | Admitting: Family Medicine

## 2021-09-24 ENCOUNTER — Other Ambulatory Visit: Payer: Self-pay

## 2021-09-24 ENCOUNTER — Encounter: Payer: Self-pay | Admitting: Family Medicine

## 2021-09-24 VITALS — BP 132/84 | HR 78 | Temp 98.2°F | Resp 16 | Ht 69.0 in | Wt 232.4 lb

## 2021-09-24 DIAGNOSIS — F32A Depression, unspecified: Secondary | ICD-10-CM

## 2021-09-24 DIAGNOSIS — Z23 Encounter for immunization: Secondary | ICD-10-CM | POA: Diagnosis not present

## 2021-09-24 DIAGNOSIS — F419 Anxiety disorder, unspecified: Secondary | ICD-10-CM | POA: Diagnosis not present

## 2021-09-24 DIAGNOSIS — I10 Essential (primary) hypertension: Secondary | ICD-10-CM | POA: Diagnosis not present

## 2021-09-24 MED ORDER — PAROXETINE HCL 10 MG PO TABS: 10.0000 mg | ORAL_TABLET | Freq: Every day | ORAL | 0 refills | Status: DC

## 2021-09-24 NOTE — Progress Notes (Signed)
Patient c/o anxiety  for 1 month. Patient has not had any panic attacks. Patient is now afraid to drive on the highway.

## 2021-09-25 NOTE — Progress Notes (Signed)
Established  Patient Office Visit  Subjective:  Patient ID: Amy Combs, female    DOB: 12-27-1960  Age: 60 y.o. MRN: [id number]  CC:  Chief Complaint  Patient presents with   Anxiety    HPI Kishana Battey presents for follow up of hypertension. Patient also reports dome increased social stressors.   Past Medical History:  Diagnosis Date   Allergy    Ankle swelling    Asthma    GERD (gastroesophageal reflux disease)    Hypertension    Primary open angle glaucoma (POAG) of right eye, severe stage     Social History   Socioeconomic History   Marital status: Single    Spouse name: Not on file   Number of children: Not on file   Years of education: Not on file   Highest education level: Not on file  Occupational History   Not on file  Tobacco Use   Smoking status: Never   Smokeless tobacco: Never  Vaping Use   Vaping Use: Never used  Substance and Sexual Activity   Alcohol use: Yes    Comment: occasional   Drug use: Not Currently    Types: Marijuana   Sexual activity: Not on file  Other Topics Concern   Not on file  Social History Narrative   Not on file   Social Determinants of Health   Financial Resource Strain: Not on file  Food Insecurity: Not on file  Transportation Needs: Not on file  Physical Activity: Not on file  Stress: Not on file  Social Connections: Not on file  Intimate Partner Violence: Not on file    ROS Review of Systems  Cardiovascular:  Negative for chest pain and palpitations.  Psychiatric/Behavioral:  Negative for self-injury, sleep disturbance and suicidal ideas. The patient is nervous/anxious.   All other systems reviewed and are negative.  Objective:   Today's Vitals: BP 132/84   Pulse 78   Temp 98.2 F (36.8 C) (Oral)   Resp 16   Ht 5\' 9"  (1.753 m)   Wt 232 lb 6.4 oz (105.4 kg)   LMP 02/27/2016   SpO2 95%   BMI 34.32 kg/m   Physical Exam Vitals and nursing note reviewed.  Constitutional:      General:  She is not in acute distress. Cardiovascular:     Rate and Rhythm: Normal rate and regular rhythm.  Pulmonary:     Effort: Pulmonary effort is normal.     Breath sounds: Normal breath sounds.  Abdominal:     Palpations: Abdomen is soft.     Tenderness: There is no abdominal tenderness.  Musculoskeletal:     Right lower leg: No edema.     Left lower leg: No edema.  Neurological:     General: No focal deficit present.     Mental Status: She is alert and oriented to person, place, and time.    Assessment & Plan:   1. Essential hypertension Slightly elevated reading. Discussed compliance. Continue present management  2. Anxiety and depression Paxil 10 mg po prescribed. Will monitor  3. Need for influenza vaccination  - Flu Vaccine QUAD 71mo+IM (Fluarix, Fluzone & Alfiuria Quad PF)    Outpatient Encounter Medications as of 09/24/2021  Medication Sig   albuterol (PROVENTIL) (2.5 MG/3ML) 0.083% nebulizer solution Take 3 mLs (2.5 mg total) by nebulization every 4 (four) hours as needed. (Patient taking differently: Take 2.5 mg by nebulization every 4 (four) hours as needed for shortness of breath or wheezing.)  amLODipine (NORVASC) 10 MG tablet Take 1 tablet (10 mg total) by mouth daily. Dx Code: I10 (Patient taking differently: Take 10 mg by mouth in the morning. Dx Code: I10)   chlorthalidone (HYGROTON) 25 MG tablet Take 1 tablet (25 mg total) by mouth daily. Dx Code: I10 (Patient taking differently: Take 25 mg by mouth in the morning. Dx Code: I10)   famotidine (PEPCID) 40 MG tablet Take 1 tablet (40 mg total) by mouth daily. (Patient taking differently: Take 40 mg by mouth daily in the afternoon.)   latanoprost (XALATAN) 0.005 % ophthalmic solution Place 1 drop into both eyes at bedtime.   montelukast (SINGULAIR) 10 MG tablet Take 10 mg by mouth at bedtime as needed (asthmatic symptoms).   Omega 3-6-9 Fatty Acids (OMEGA 3-6-9 COMPLEX PO) Take 1 capsule by mouth in the morning. Krill  Omega 50+ with CoQ10   pantoprazole (PROTONIX) 40 MG tablet TAKE 1 TABLET BY MOUTH EVERY DAY (Patient taking differently: Take 40 mg by mouth daily before breakfast.)   PARoxetine (PAXIL) 10 MG tablet Take 1 tablet (10 mg total) by mouth daily.   SIMBRINZA 1-0.2 % SUSP Place 1 drop into both eyes 2 (two) times daily.   SYMBICORT 160-4.5 MCG/ACT inhaler TAKE 2 PUFFS BY MOUTH TWICE A DAY   telmisartan (MICARDIS) 40 MG tablet Take 1 tablet (40 mg total) by mouth daily. Dx Code: I10 (Patient taking differently: Take 40 mg by mouth in the morning. Dx Code: I10)   valACYclovir (VALTREX) 1000 MG tablet TAKE 1 TABLET BY MOUTH TWICE A DAY   vitamin B-12 (CYANOCOBALAMIN) 1000 MCG tablet Take 1,000 mcg by mouth in the morning. (Patient not taking: Reported on 09/24/2021)   No facility-administered encounter medications on file as of 09/24/2021.    Follow-up: Return in about 4 weeks (around 10/22/2021) for follow up.   Becky Sax, MD

## 2021-09-29 ENCOUNTER — Other Ambulatory Visit: Payer: Self-pay | Admitting: *Deleted

## 2021-09-29 MED ORDER — TELMISARTAN 40 MG PO TABS: 40.0000 mg | ORAL_TABLET | Freq: Every day | ORAL | 1 refills | Status: DC

## 2021-09-29 MED ORDER — CHLORTHALIDONE 25 MG PO TABS: 25.0000 mg | ORAL_TABLET | Freq: Every day | ORAL | 1 refills | Status: DC

## 2021-09-29 MED ORDER — AMLODIPINE BESYLATE 10 MG PO TABS: 10.0000 mg | ORAL_TABLET | Freq: Every day | ORAL | 1 refills | Status: DC

## 2021-10-22 ENCOUNTER — Other Ambulatory Visit: Payer: Self-pay

## 2021-10-22 ENCOUNTER — Encounter: Payer: Self-pay | Admitting: Family Medicine

## 2021-10-22 ENCOUNTER — Ambulatory Visit: Payer: No Typology Code available for payment source | Admitting: Family Medicine

## 2021-10-22 VITALS — BP 145/83 | HR 78 | Temp 98.0°F | Resp 16 | Wt 237.4 lb

## 2021-10-22 DIAGNOSIS — F419 Anxiety disorder, unspecified: Secondary | ICD-10-CM | POA: Diagnosis not present

## 2021-10-22 DIAGNOSIS — F32A Depression, unspecified: Secondary | ICD-10-CM

## 2021-10-22 DIAGNOSIS — Z23 Encounter for immunization: Secondary | ICD-10-CM | POA: Diagnosis not present

## 2021-10-22 DIAGNOSIS — I10 Essential (primary) hypertension: Secondary | ICD-10-CM

## 2021-10-22 MED ORDER — TELMISARTAN 80 MG PO TABS: 80.0000 mg | ORAL_TABLET | Freq: Every day | ORAL | 1 refills | Status: DC

## 2021-10-22 MED ORDER — PAROXETINE HCL 20 MG PO TABS: 20.0000 mg | ORAL_TABLET | Freq: Every day | ORAL | 1 refills | Status: DC

## 2021-10-22 NOTE — Progress Notes (Signed)
[facility] [place] [address] Dept: [phone]  FOLLOW UP NOTE  Patient ID: Amy Combs, female    DOB: 08-23-1961  Age: 60 y.o. MRN: [id number] Date of Office Visit: 10/23/2021  Assessment  Chief Complaint: Asthma (Going fine!) and allergic rhinoconjunctivitis (Going fine!)  HPI Amy Combs is a 60 year old female who presents to the clinic for follow-up visit.  She was last seen in this clinic on 09/23/2020 by Dr. Neldon Mc for evaluation of asthma, allergic rhinitis, allergic conjunctivitis, and reflux.  At today's visit, she reports her asthma has been well controlled with no shortness of breath, cough, or wheeze with activity or rest.  She denies any nighttime symptoms of asthma.  She continues montelukast 10 mg once a day, Symbicort 160-2 puffs twice a day, and rarely uses albuterol which provides relief of symptoms when needed.  Allergic rhinitis is reported as well controlled with no rhinorrhea, nasal congestion, sneeze, or postnasal drainage.  She continues Claritin 10 mg once a day as needed and has not needed to use Flonase yet this year.  She reports poor Flonase application technique. Her last environmental allergy testing was on 05/11/2016 was positive to grass pollen, weed pollen, ragweed pollen, tree pollen, mold, and dust mite. Allergic conjunctivitis is reported as poorly controlled with symptoms including red and itchy eyes as well as clear drainage.  She has recently started using Bepreve and has noticed an improvement in her symptoms.  She is not currently using a lubricating eyedrop.  Reflux is reported as poorly controlled with heartburn occurring several nights a week over the last 2 to 3 weeks while she has been out of pantoprazole.  Prior to that, she reports reflux had been well controlled with pantoprazole in the morning and famotidine in the evening.  Her current medications are listed in the chart.  Drug Allergies:  Allergies  Allergen Reactions    Other     Other reaction(s): Eye Redness Dust, ragweed, grass   Lisinopril Swelling    Physical Exam: BP 138/70   Pulse 84   Temp 98.1 F (36.7 C)   Resp 16   Ht 5\' 9"  (1.753 m)   Wt 240 lb 12.8 oz (109.2 kg)   LMP 02/27/2016   SpO2 96%   BMI 35.56 kg/m    Physical Exam Vitals reviewed.  Constitutional:      Appearance: Normal appearance.  HENT:     Head: Normocephalic and atraumatic.     Right Ear: Tympanic membrane normal.     Left Ear: Tympanic membrane normal.     Nose:     Comments: Bilateral naris edematous and pale with clear nasal drainage noted.  Pharynx normal.  Ears normal.  Eyes normal.    Mouth/Throat:     Pharynx: Oropharynx is clear.  Eyes:     Conjunctiva/sclera: Conjunctivae normal.  Cardiovascular:     Rate and Rhythm: Normal rate and regular rhythm.     Heart sounds: Normal heart sounds. No murmur heard. Pulmonary:     Effort: Pulmonary effort is normal.     Breath sounds: Normal breath sounds.     Comments: Lungs clear to auscultation Musculoskeletal:        General: Normal range of motion.     Cervical back: Normal range of motion and neck supple.  Skin:    General: Skin is warm and dry.  Neurological:     Mental Status: She is alert and oriented to person, place, and time.  Psychiatric:  Mood and Affect: Mood normal.        Behavior: Behavior normal.        Thought Content: Thought content normal.        Judgment: Judgment normal.    Diagnostics: FVC 2.75, FEV1 1.79.  Predicted FVC 3.25, predicted FEV1 2.  5 5.  Spirometry indicates mild obstruction.  This is consistent with previous spirometry readings.  Assessment and Plan: 1. Asthma, moderate persistent, well-controlled   2. Seasonal and perennial allergic rhinitis   3. Seasonal allergic conjunctivitis   4. LPRD (laryngopharyngeal reflux disease)     Meds ordered this encounter  Medications   albuterol (PROVENTIL) (2.5 MG/3ML) 0.083% nebulizer solution    Sig: Take 3  mLs (2.5 mg total) by nebulization every 4 (four) hours as needed.    Dispense:  180 mL    Refill:  1   famotidine (PEPCID) 40 MG tablet    Sig: Take 1 tablet (40 mg total) by mouth daily.    Dispense:  90 tablet    Refill:  1   pantoprazole (PROTONIX) 40 MG tablet    Sig: Take 1 tablet (40 mg total) by mouth daily.    Dispense:  90 tablet    Refill:  1   SYMBICORT 160-4.5 MCG/ACT inhaler    Sig: Inhale 2 puffs into the lungs 2 (two) times daily. TAKE 2 PUFFS BY MOUTH TWICE A DAY    Dispense:  30.6 g    Refill:  1    This is a courtesy refill. Patient needs an OV for further refills.    Patient Instructions   1. Continue Symbicort 160 -2 inhalations twice a day and montelukast daily  2. Continue pantoprazole 40 mg in the morning and famotidine 40  mg in evening  3. Continue ProAir HFA or albuterol nebulization every 4-6 hours if needed  4. Continue OTC antihistamine if needed. Continue Bepreve eye drops as needed  5. "Action plan" for asthma flare up:   A. continue Symbicort 160 2 inhalations twice a day  B. add Qvar 80 3 inhalations 3 times a day  C. use ProAir HFA or albuterol nebulization if needed  6. Return to clinic in 6 months or earlier if problem  7. Obtain fall flu vaccine and Covid booster   Return in about 6 months (around 04/22/2022), or if symptoms worsen or fail to improve.    Thank you for the opportunity to care for this patient.  Please do not hesitate to contact me with questions.  Gareth Morgan, FNP [facility]

## 2021-10-22 NOTE — Patient Instructions (Addendum)
  1. Continue Symbicort 160 -2 inhalations twice a day and montelukast daily  2. Continue pantoprazole 40 mg in the morning and famotidine 40  mg in evening  3. Continue ProAir HFA or albuterol nebulization every 4-6 hours if needed  4. Continue OTC antihistamine if needed. Continue Bepreve eye drops as needed  5. "Action plan" for asthma flare up:   A. continue Symbicort 160 2 inhalations twice a day  B. add Qvar 80 3 inhalations 3 times a day  C. use ProAir HFA or albuterol nebulization if needed  6. Return to clinic in 6 months or earlier if problem  7. Obtain fall flu vaccine and Covid booster

## 2021-10-22 NOTE — Progress Notes (Signed)
Established Patient Office Visit  Subjective:  Patient ID: Amy Combs, female    DOB: 01/02/1961  Age: 60 y.o. MRN: [id number]  CC:  Chief Complaint  Patient presents with   Follow-up   Anxiety    HPI Amy Combs presents for follow up of anxiety/depression and hypertension. Patient reports improvement in her mood with present management.  Past Medical History:  Diagnosis Date   Allergy    Ankle swelling    Asthma    GERD (gastroesophageal reflux disease)    Hypertension    Primary open angle glaucoma (POAG) of right eye, severe stage     Social History   Socioeconomic History   Marital status: Single    Spouse name: Not on file   Number of children: Not on file   Years of education: Not on file   Highest education level: Not on file  Occupational History   Not on file  Tobacco Use   Smoking status: Never   Smokeless tobacco: Never  Vaping Use   Vaping Use: Never used  Substance and Sexual Activity   Alcohol use: Yes    Comment: occasional   Drug use: Not Currently    Types: Marijuana   Sexual activity: Not on file  Other Topics Concern   Not on file  Social History Narrative   Not on file   Social Determinants of Health   Financial Resource Strain: Not on file  Food Insecurity: Not on file  Transportation Needs: Not on file  Physical Activity: Not on file  Stress: Not on file  Social Connections: Not on file  Intimate Partner Violence: Not on file    ROS Review of Systems  Cardiovascular:  Negative for chest pain and palpitations.  Psychiatric/Behavioral:  Negative for self-injury, sleep disturbance and suicidal ideas. The patient is nervous/anxious.   All other systems reviewed and are negative.  Objective:   Today's Vitals: BP (!) 145/83   Pulse 78   Temp 98 F (36.7 C) (Oral)   Resp 16   Wt 237 lb 6.4 oz (107.7 kg)   LMP 02/27/2016   SpO2 99%   BMI 35.06 kg/m   Physical Exam Vitals and nursing note reviewed.   Constitutional:      General: She is not in acute distress. Cardiovascular:     Rate and Rhythm: Normal rate and regular rhythm.  Pulmonary:     Effort: Pulmonary effort is normal.     Breath sounds: Normal breath sounds.  Abdominal:     Palpations: Abdomen is soft.     Tenderness: There is no abdominal tenderness.  Musculoskeletal:     Right lower leg: No edema.     Left lower leg: No edema.  Neurological:     General: No focal deficit present.     Mental Status: She is alert and oriented to person, place, and time.  Psychiatric:        Mood and Affect: Mood and affect normal.        Behavior: Behavior normal.    Assessment & Plan:    1. Essential hypertension Readings slightly elevated. Discussed compliance (dietary). Will increase Micardis from 40 mg to 80 mg and monitor.  - telmisartan (MICARDIS) 80 MG tablet; Take 1 tablet (80 mg total) by mouth daily.  Dispense: 90 tablet; Refill: 1  2. Anxiety and depression Greatly improved. Will increase Paxil from 10 mg daily to 20 mg daily and monitor - PARoxetine (PAXIL) 20 MG tablet; Take 1 tablet (  20 mg total) by mouth daily.  Dispense: 90 tablet; Refill: 1  3. Need for shingles vaccine  - Varicella-zoster vaccine IM   Outpatient Encounter Medications as of 10/22/2021  Medication Sig   albuterol (PROVENTIL) (2.5 MG/3ML) 0.083% nebulizer solution Take 3 mLs (2.5 mg total) by nebulization every 4 (four) hours as needed. (Patient taking differently: Take 2.5 mg by nebulization every 4 (four) hours as needed for shortness of breath or wheezing.)   amLODipine (NORVASC) 10 MG tablet Take 1 tablet (10 mg total) by mouth daily. Dx Code: I10   chlorthalidone (HYGROTON) 25 MG tablet Take 1 tablet (25 mg total) by mouth daily. Dx Code: I10   famotidine (PEPCID) 40 MG tablet Take 1 tablet (40 mg total) by mouth daily. (Patient taking differently: Take 40 mg by mouth daily in the afternoon.)   latanoprost (XALATAN) 0.005 % ophthalmic  solution Place 1 drop into both eyes at bedtime.   montelukast (SINGULAIR) 10 MG tablet Take 10 mg by mouth at bedtime as needed (asthmatic symptoms).   Omega 3-6-9 Fatty Acids (OMEGA 3-6-9 COMPLEX PO) Take 1 capsule by mouth in the morning. Krill Omega 50+ with CoQ10   pantoprazole (PROTONIX) 40 MG tablet TAKE 1 TABLET BY MOUTH EVERY DAY (Patient taking differently: Take 40 mg by mouth daily before breakfast.)   PARoxetine (PAXIL) 10 MG tablet Take 1 tablet (10 mg total) by mouth daily.   PARoxetine (PAXIL) 20 MG tablet Take 1 tablet (20 mg total) by mouth daily.   SIMBRINZA 1-0.2 % SUSP Place 1 drop into both eyes 2 (two) times daily.   SYMBICORT 160-4.5 MCG/ACT inhaler TAKE 2 PUFFS BY MOUTH TWICE A DAY   telmisartan (MICARDIS) 80 MG tablet Take 1 tablet (80 mg total) by mouth daily.   valACYclovir (VALTREX) 1000 MG tablet TAKE 1 TABLET BY MOUTH TWICE A DAY   vitamin B-12 (CYANOCOBALAMIN) 1000 MCG tablet Take 1,000 mcg by mouth in the morning.   [DISCONTINUED] telmisartan (MICARDIS) 40 MG tablet Take 1 tablet (40 mg total) by mouth daily. Dx Code: P53   No facility-administered encounter medications on file as of 10/22/2021.    Follow-up: Return in about 6 months (around 04/21/2022) for follow up, chronic med issues.   Becky Sax, MD

## 2021-10-22 NOTE — Progress Notes (Signed)
Patient said that she is doing better since starting new medication.  Shingle  vaccine given today

## 2021-10-23 ENCOUNTER — Encounter: Payer: Self-pay | Admitting: Family Medicine

## 2021-10-23 ENCOUNTER — Ambulatory Visit: Payer: No Typology Code available for payment source | Admitting: Family Medicine

## 2021-10-23 VITALS — BP 138/70 | HR 84 | Temp 98.1°F | Resp 16 | Ht 69.0 in | Wt 240.8 lb

## 2021-10-23 DIAGNOSIS — K219 Gastro-esophageal reflux disease without esophagitis: Secondary | ICD-10-CM

## 2021-10-23 DIAGNOSIS — J302 Other seasonal allergic rhinitis: Secondary | ICD-10-CM | POA: Insufficient documentation

## 2021-10-23 DIAGNOSIS — H1010 Acute atopic conjunctivitis, unspecified eye: Secondary | ICD-10-CM | POA: Diagnosis not present

## 2021-10-23 DIAGNOSIS — J4540 Moderate persistent asthma, uncomplicated: Secondary | ICD-10-CM | POA: Diagnosis not present

## 2021-10-23 DIAGNOSIS — J3089 Other allergic rhinitis: Secondary | ICD-10-CM | POA: Diagnosis not present

## 2021-10-23 MED ORDER — ALBUTEROL SULFATE (2.5 MG/3ML) 0.083% IN NEBU: 2.5000 mg | INHALATION_SOLUTION | RESPIRATORY_TRACT | 1 refills | Status: DC | PRN

## 2021-10-23 MED ORDER — FAMOTIDINE 40 MG PO TABS: 40.0000 mg | ORAL_TABLET | Freq: Every day | ORAL | 1 refills | Status: DC

## 2021-10-23 MED ORDER — PANTOPRAZOLE SODIUM 40 MG PO TBEC: 40.0000 mg | DELAYED_RELEASE_TABLET | Freq: Every day | ORAL | 1 refills | Status: DC

## 2021-10-23 MED ORDER — SYMBICORT 160-4.5 MCG/ACT IN AERO: 2.0000 | INHALATION_SPRAY | Freq: Two times a day (BID) | RESPIRATORY_TRACT | 1 refills | Status: DC

## 2021-11-27 ENCOUNTER — Ambulatory Visit: Payer: No Typology Code available for payment source | Admitting: Family Medicine

## 2021-11-27 ENCOUNTER — Other Ambulatory Visit: Payer: Self-pay

## 2021-11-27 ENCOUNTER — Encounter: Payer: Self-pay | Admitting: Family Medicine

## 2021-11-27 VITALS — BP 132/79 | HR 76 | Temp 98.5°F | Resp 16 | Wt 238.0 lb

## 2021-11-27 DIAGNOSIS — W19XXXA Unspecified fall, initial encounter: Secondary | ICD-10-CM | POA: Diagnosis not present

## 2021-11-27 DIAGNOSIS — M545 Low back pain, unspecified: Secondary | ICD-10-CM

## 2021-11-27 MED ORDER — CYCLOBENZAPRINE HCL 5 MG PO TABS: 5.0000 mg | ORAL_TABLET | Freq: Three times a day (TID) | ORAL | 1 refills | Status: AC | PRN

## 2021-11-27 NOTE — Progress Notes (Signed)
Patient is here because she fell going to the care on 11/20/2021. Patient said the right side is where most of the pain is but sometimes it moves over to the center of herr back Patient has been using heat and ice and ibuprofen and tylenol.

## 2021-11-30 ENCOUNTER — Encounter: Payer: Self-pay | Admitting: Family Medicine

## 2021-11-30 NOTE — Progress Notes (Signed)
Established Patient Office Visit  Subjective:  Patient ID: Amy Combs, female    DOB: [birth year]  Age: 60 y.o. MRN: [id number]  CC:  Chief Complaint  Patient presents with   Back Pain    HPI Amy Combs presents for complaint of back pain after suffering a slip and fall incident. Denies head trauma or syncope.    Past Medical History:  Diagnosis Date   Allergy    Ankle swelling    Asthma    GERD (gastroesophageal reflux disease)    Hypertension    Primary open angle glaucoma (POAG) of right eye, severe stage     Past Surgical History:  Procedure Laterality Date   ABDOMINAL SURGERY     mesh placed for scar tissue   CESAREAN SECTION CLASSICAL     COLONOSCOPY  06/06/2020   TUBAL LIGATION      Family History  Problem Relation Age of Onset   High blood pressure Mother    Cataracts Mother    Glaucoma Mother    High blood pressure Father    Cataracts Maternal Grandfather    Glaucoma Maternal Grandfather    Breast cancer Paternal Aunt    Colon cancer Neg Hx    Esophageal cancer Neg Hx    Rectal cancer Neg Hx    Stomach cancer Neg Hx    Inflammatory bowel disease Neg Hx    Liver disease Neg Hx    Pancreatic cancer Neg Hx     Social History   Socioeconomic History   Marital status: Single    Spouse name: Not on file   Number of children: Not on file   Years of education: Not on file   Highest education level: Not on file  Occupational History   Not on file  Tobacco Use   Smoking status: Never   Smokeless tobacco: Never  Vaping Use   Vaping Use: Never used  Substance and Sexual Activity   Alcohol use: Yes    Comment: occasional   Drug use: Not Currently    Types: Marijuana   Sexual activity: Not on file  Other Topics Concern   Not on file  Social History Narrative   Not on file   Social Determinants of Health   Financial Resource Strain: Not on file  Food Insecurity: Not on file  Transportation Needs: Not on file  Physical  Activity: Not on file  Stress: Not on file  Social Connections: Not on file  Intimate Partner Violence: Not on file    ROS Review of Systems  Musculoskeletal:  Positive for back pain.  All other systems reviewed and are negative.  Objective:   Today's Vitals: BP 132/79   Pulse 76   Temp 98.5 F (36.9 C) (Oral)   Resp 16   Wt 238 lb (108 kg)   LMP 02/27/2016   SpO2 94%   BMI 35.15 kg/m   Physical Exam Vitals and nursing note reviewed.  Constitutional:      General: She is not in acute distress. Cardiovascular:     Rate and Rhythm: Normal rate and regular rhythm.  Pulmonary:     Effort: Pulmonary effort is normal.     Breath sounds: Normal breath sounds.  Abdominal:     Palpations: Abdomen is soft.     Tenderness: There is no abdominal tenderness.  Musculoskeletal:     Lumbar back: Spasms and tenderness present. No swelling or deformity. Decreased range of motion.  Neurological:     General: No  focal deficit present.     Mental Status: She is alert and oriented to person, place, and time.    Assessment & Plan:   1. Acute right-sided low back pain without sciatica Tylenol/nsaids/topical preps prn. Exercises given. Monitor.  2. Fall, initial encounter     Outpatient Encounter Medications as of 11/27/2021  Medication Sig   albuterol (PROVENTIL) (2.5 MG/3ML) 0.083% nebulizer solution Take 3 mLs (2.5 mg total) by nebulization every 4 (four) hours as needed.   amLODipine (NORVASC) 10 MG tablet Take 1 tablet (10 mg total) by mouth daily. Dx Code: I10   Bepotastine Besilate 1.5 % SOLN Place 1 drop into both eyes 2 (two) times daily.   chlorthalidone (HYGROTON) 25 MG tablet Take 1 tablet (25 mg total) by mouth daily. Dx Code: I10   cyclobenzaprine (FLEXERIL) 5 MG tablet Take 1 tablet (5 mg total) by mouth 3 (three) times daily as needed for muscle spasms.   famotidine (PEPCID) 40 MG tablet Take 1 tablet (40 mg total) by mouth daily.   latanoprost (XALATAN) 0.005 %  ophthalmic solution Place 1 drop into both eyes at bedtime.   montelukast (SINGULAIR) 10 MG tablet Take 10 mg by mouth at bedtime as needed (asthmatic symptoms).   Omega 3-6-9 Fatty Acids (OMEGA 3-6-9 COMPLEX PO) Take 1 capsule by mouth in the morning. Krill Omega 50+ with CoQ10   pantoprazole (PROTONIX) 40 MG tablet Take 1 tablet (40 mg total) by mouth daily.   PARoxetine (PAXIL) 20 MG tablet Take 1 tablet (20 mg total) by mouth daily.   SIMBRINZA 1-0.2 % SUSP Place 1 drop into both eyes 2 (two) times daily.   SYMBICORT 160-4.5 MCG/ACT inhaler Inhale 2 puffs into the lungs 2 (two) times daily. TAKE 2 PUFFS BY MOUTH TWICE A DAY   telmisartan (MICARDIS) 80 MG tablet Take 1 tablet (80 mg total) by mouth daily.   valACYclovir (VALTREX) 1000 MG tablet TAKE 1 TABLET BY MOUTH TWICE A DAY   vitamin B-12 (CYANOCOBALAMIN) 1000 MCG tablet Take 1,000 mcg by mouth in the morning.   No facility-administered encounter medications on file as of 11/27/2021.    Follow-up: No follow-ups on file.   Becky Sax, MD

## 2021-12-04 ENCOUNTER — Telehealth: Payer: Self-pay | Admitting: Family Medicine

## 2021-12-04 NOTE — Telephone Encounter (Signed)
Left message on voicemail to return call.

## 2021-12-04 NOTE — Telephone Encounter (Signed)
Pt states her pain since her last appt has worsened and it's been waking her up at night. Pt is asking if she can come in today for a "shot"  for her pain this after noon? Pt awaits PCP's advice.    Thank you.

## 2021-12-07 ENCOUNTER — Other Ambulatory Visit: Payer: Self-pay

## 2021-12-07 ENCOUNTER — Encounter: Payer: Self-pay | Admitting: Family Medicine

## 2021-12-07 ENCOUNTER — Ambulatory Visit: Payer: No Typology Code available for payment source | Admitting: Family Medicine

## 2021-12-07 ENCOUNTER — Ambulatory Visit (INDEPENDENT_AMBULATORY_CARE_PROVIDER_SITE_OTHER): Payer: No Typology Code available for payment source

## 2021-12-07 VITALS — BP 159/91 | HR 78 | Temp 98.4°F | Resp 16 | Wt 237.8 lb

## 2021-12-07 DIAGNOSIS — M25552 Pain in left hip: Secondary | ICD-10-CM

## 2021-12-07 DIAGNOSIS — M545 Low back pain, unspecified: Secondary | ICD-10-CM

## 2021-12-07 DIAGNOSIS — W19XXXD Unspecified fall, subsequent encounter: Secondary | ICD-10-CM | POA: Diagnosis not present

## 2021-12-07 MED ORDER — KETOROLAC TROMETHAMINE 60 MG/2ML IM SOLN
60.0000 mg | Freq: Once | INTRAMUSCULAR | Status: AC
Start: 2021-12-07 — End: 2021-12-07
  Administered 2021-12-07: 12:00:00 60 mg via INTRAMUSCULAR

## 2021-12-07 MED ORDER — TRIAMCINOLONE ACETONIDE 40 MG/ML IJ SUSP
40.0000 mg | Freq: Once | INTRAMUSCULAR | Status: AC
  Administered 2021-12-07: 12:00:00 40 mg via INTRAMUSCULAR

## 2021-12-07 NOTE — Progress Notes (Signed)
Established Patient Office Visit  Subjective:  Patient ID: Amy Combs, female    DOB: 1961/06/03  Age: 60 y.o. MRN: [id number]  CC:  Chief Complaint  Patient presents with   Leg Pain    HPI Amy Combs presents for follow up of recent fall with initial back pain. She reports that the pain is now in her left hip and with increasing pain and difficulty ambulating and doing activities.   Past Medical History:  Diagnosis Date   Allergy    Ankle swelling    Asthma    GERD (gastroesophageal reflux disease)    Hypertension    Primary open angle glaucoma (POAG) of right eye, severe stage     Past Surgical History:  Procedure Laterality Date   ABDOMINAL SURGERY     mesh placed for scar tissue   CESAREAN SECTION CLASSICAL     COLONOSCOPY  06/06/2020   TUBAL LIGATION      Family History  Problem Relation Age of Onset   High blood pressure Mother    Cataracts Mother    Glaucoma Mother    High blood pressure Father    Cataracts Maternal Grandfather    Glaucoma Maternal Grandfather    Breast cancer Paternal Aunt    Colon cancer Neg Hx    Esophageal cancer Neg Hx    Rectal cancer Neg Hx    Stomach cancer Neg Hx    Inflammatory bowel disease Neg Hx    Liver disease Neg Hx    Pancreatic cancer Neg Hx     Social History   Socioeconomic History   Marital status: Single    Spouse name: Not on file   Number of children: Not on file   Years of education: Not on file   Highest education level: Not on file  Occupational History   Not on file  Tobacco Use   Smoking status: Never   Smokeless tobacco: Never  Vaping Use   Vaping Use: Never used  Substance and Sexual Activity   Alcohol use: Yes    Comment: occasional   Drug use: Not Currently    Types: Marijuana   Sexual activity: Not on file  Other Topics Concern   Not on file  Social History Narrative   Not on file   Social Determinants of Health   Financial Resource Strain: Not on file  Food  Insecurity: Not on file  Transportation Needs: Not on file  Physical Activity: Not on file  Stress: Not on file  Social Connections: Not on file  Intimate Partner Violence: Not on file    ROS Review of Systems  Musculoskeletal:  Positive for back pain.  All other systems reviewed and are negative.  Objective:   Today's Vitals: BP (!) 159/91    Pulse 78    Temp 98.4 F (36.9 C) (Oral)    Resp 16    Wt 237 lb 12.8 oz (107.9 kg)    LMP 02/27/2016    BMI 35.12 kg/m   Physical Exam Vitals and nursing note reviewed.  Constitutional:      General: She is not in acute distress. Cardiovascular:     Rate and Rhythm: Normal rate and regular rhythm.  Pulmonary:     Effort: Pulmonary effort is normal.     Breath sounds: Normal breath sounds.  Abdominal:     Palpations: Abdomen is soft.     Tenderness: There is no abdominal tenderness.  Musculoskeletal:     Lumbar back: Tenderness present.  No swelling, deformity or spasms. Decreased range of motion.     Left hip: Tenderness present. No deformity. Decreased range of motion. Normal strength.  Neurological:     General: No focal deficit present.     Mental Status: She is alert and oriented to person, place, and time.    Assessment & Plan:   1. Left hip pain Xray of left hip ordered. Kenalog and toradol IM injection given. Monitor  - DG Hip Unilat W OR W/O Pelvis 2-3 Views Left; Future - triamcinolone acetonide (KENALOG-40) injection 40 mg - ketorolac (TORADOL) injection 60 mg  2. Acute right-sided low back pain without sciatica Back pain appaers stable/decreased with pain now migrated to left hip. IM injections as above. Continue present management and monitor  - triamcinolone acetonide (KENALOG-40) injection 40 mg - ketorolac (TORADOL) injection 60 mg  3. Fall, subsequent encounter     Outpatient Encounter Medications as of 12/07/2021  Medication Sig   albuterol (PROVENTIL) (2.5 MG/3ML) 0.083% nebulizer solution Take 3  mLs (2.5 mg total) by nebulization every 4 (four) hours as needed.   amLODipine (NORVASC) 10 MG tablet Take 1 tablet (10 mg total) by mouth daily. Dx Code: I10   Bepotastine Besilate 1.5 % SOLN Place 1 drop into both eyes 2 (two) times daily.   chlorthalidone (HYGROTON) 25 MG tablet Take 1 tablet (25 mg total) by mouth daily. Dx Code: I10   cyclobenzaprine (FLEXERIL) 5 MG tablet Take 1 tablet (5 mg total) by mouth 3 (three) times daily as needed for muscle spasms.   famotidine (PEPCID) 40 MG tablet Take 1 tablet (40 mg total) by mouth daily.   latanoprost (XALATAN) 0.005 % ophthalmic solution Place 1 drop into both eyes at bedtime.   montelukast (SINGULAIR) 10 MG tablet Take 10 mg by mouth at bedtime as needed (asthmatic symptoms).   Omega 3-6-9 Fatty Acids (OMEGA 3-6-9 COMPLEX PO) Take 1 capsule by mouth in the morning. Krill Omega 50+ with CoQ10   pantoprazole (PROTONIX) 40 MG tablet Take 1 tablet (40 mg total) by mouth daily.   PARoxetine (PAXIL) 20 MG tablet Take 1 tablet (20 mg total) by mouth daily.   SIMBRINZA 1-0.2 % SUSP Place 1 drop into both eyes 2 (two) times daily.   SYMBICORT 160-4.5 MCG/ACT inhaler Inhale 2 puffs into the lungs 2 (two) times daily. TAKE 2 PUFFS BY MOUTH TWICE A DAY   telmisartan (MICARDIS) 80 MG tablet Take 1 tablet (80 mg total) by mouth daily.   valACYclovir (VALTREX) 1000 MG tablet TAKE 1 TABLET BY MOUTH TWICE A DAY   vitamin B-12 (CYANOCOBALAMIN) 1000 MCG tablet Take 1,000 mcg by mouth in the morning.   [EXPIRED] ketorolac (TORADOL) injection 60 mg    [EXPIRED] triamcinolone acetonide (KENALOG-40) injection 40 mg    No facility-administered encounter medications on file as of 12/07/2021.    Follow-up: No follow-ups on file.   Becky Sax, MD

## 2021-12-07 NOTE — Progress Notes (Signed)
Patient is requesting xray of left hip. Patient has been dealing with pain for awhile and is not getting any better. Patient is also requesting a shot for pain.

## 2021-12-07 NOTE — Progress Notes (Signed)
Patient has no new concerns today

## 2021-12-24 ENCOUNTER — Ambulatory Visit: Payer: No Typology Code available for payment source | Admitting: Family Medicine

## 2022-01-07 ENCOUNTER — Other Ambulatory Visit: Payer: Self-pay

## 2022-01-07 ENCOUNTER — Ambulatory Visit (INDEPENDENT_AMBULATORY_CARE_PROVIDER_SITE_OTHER): Payer: No Typology Code available for payment source

## 2022-01-07 DIAGNOSIS — Z23 Encounter for immunization: Secondary | ICD-10-CM

## 2022-01-27 ENCOUNTER — Telehealth: Payer: Self-pay | Admitting: Family Medicine

## 2022-01-27 NOTE — Telephone Encounter (Signed)
Would the Breztri with coupon be a cheaper alternative with her deductible issue?

## 2022-01-27 NOTE — Telephone Encounter (Signed)
Amy Combs called in and stated she went to pick up her Symbicort from the pharmacy and it was over $200 dollars.  Amy Combs would like to know if something else similar to Symbicort can be called in to [facility] on [address] in [place].  Patient does have follow up appointment scheduled with Dr. Neldon Mc.  Please advise

## 2022-01-27 NOTE — Telephone Encounter (Signed)
Patient came into the office looking for samples of Symbicort and we did not have any.  Patient has a deductible of $800 and that is why she is having to pay so much for her Symbicort.  I have spoken with the patient an she is understanding of this.

## 2022-01-27 NOTE — Telephone Encounter (Signed)
Please investigate which combination inhaler [employer] will cover at a reasonable price and let me know.

## 2022-01-28 ENCOUNTER — Telehealth: Payer: Self-pay

## 2022-01-28 NOTE — Telephone Encounter (Signed)
Patient called stating she checked with  her insurance and Advair is approved and much cheaper. Patient wants to know if Dr Neldon Mc is okay with Advair ?

## 2022-01-28 NOTE — Telephone Encounter (Signed)
Patient was calling to see if we found anything, I also told her to see if her ins had any suggestions on the price of other inhalers. [phone].

## 2022-01-28 NOTE — Telephone Encounter (Signed)
Amy Combs called back this morning and stated that she spoke with the insurance company and was told that it was NOT a deductible issue and that the "price of symbicort just went up"  so Amy Combs would like something else that would be cheaper called in.  I guess back to the original message, what is an alternative?

## 2022-01-28 NOTE — Telephone Encounter (Signed)
Letter was sent to patient in Dec 2022. Called again today and had to leave message asking pt to return call to office and let us know what she would like to do.

## 2022-01-28 NOTE — Telephone Encounter (Signed)
-----   Message from Irving Copas., MD sent at 01/28/2022  2:28 PM EST ----- Regarding: Follow-up Amy Combs, Wanted to follow-up with you. Where do we stand about this patient wanting to move forward with her previously canceled colonoscopy or if she is just deferring this completely? Appreciate the follow-up. Please let HD and I know. Thanks. GM

## 2022-01-28 NOTE — Telephone Encounter (Signed)
What are the alternatives that her insurance company will pay for with a reasonable price?

## 2022-02-03 ENCOUNTER — Other Ambulatory Visit: Payer: Self-pay | Admitting: *Deleted

## 2022-02-03 MED ORDER — ADVAIR HFA 115-21 MCG/ACT IN AERO: 2.0000 | INHALATION_SPRAY | Freq: Two times a day (BID) | RESPIRATORY_TRACT | 5 refills | Status: DC

## 2022-02-03 MED ORDER — ADVAIR HFA 115-21 MCG/ACT IN AERO: 2.0000 | INHALATION_SPRAY | Freq: Two times a day (BID) | RESPIRATORY_TRACT | 0 refills | Status: DC

## 2022-02-03 NOTE — Telephone Encounter (Signed)
Patient called back and is requesting a 90 day supply due to insurance.

## 2022-02-03 NOTE — Telephone Encounter (Signed)
New prescription has been sent in. Called and left a voicemail asking for patient to return call to inform.  

## 2022-02-03 NOTE — Telephone Encounter (Signed)
Spoke with patient, informed her that 90 day supply has been sent in. Patient verbalized understanding.

## 2022-02-03 NOTE — Telephone Encounter (Signed)
Advair 115 - 2 inhalations 2 times per day

## 2022-02-05 NOTE — Telephone Encounter (Signed)
Left message

## 2022-02-16 NOTE — Telephone Encounter (Signed)
Left message

## 2022-02-22 ENCOUNTER — Other Ambulatory Visit: Payer: Self-pay | Admitting: Family Medicine

## 2022-02-22 NOTE — Telephone Encounter (Signed)
Patient never returned call. Dr Rush Landmark has informed primary GI MD of this.  ?

## 2022-04-03 ENCOUNTER — Other Ambulatory Visit: Payer: Self-pay | Admitting: Internal Medicine

## 2022-04-03 DIAGNOSIS — R21 Rash and other nonspecific skin eruption: Secondary | ICD-10-CM

## 2022-04-21 ENCOUNTER — Ambulatory Visit: Payer: No Typology Code available for payment source | Admitting: Family Medicine

## 2022-04-27 ENCOUNTER — Ambulatory Visit: Payer: No Typology Code available for payment source | Admitting: Family Medicine

## 2022-04-27 ENCOUNTER — Encounter: Payer: Self-pay | Admitting: Family Medicine

## 2022-04-27 ENCOUNTER — Ambulatory Visit: Payer: No Typology Code available for payment source | Admitting: Allergy and Immunology

## 2022-04-27 ENCOUNTER — Telehealth: Payer: Self-pay | Admitting: Allergy and Immunology

## 2022-04-27 ENCOUNTER — Encounter: Payer: Self-pay | Admitting: Allergy and Immunology

## 2022-04-27 VITALS — BP 130/70 | HR 80 | Temp 98.3°F | Resp 18 | Ht 67.25 in | Wt 243.8 lb

## 2022-04-27 VITALS — BP 121/80 | HR 77 | Temp 98.1°F | Resp 16 | Wt 240.0 lb

## 2022-04-27 DIAGNOSIS — H1013 Acute atopic conjunctivitis, bilateral: Secondary | ICD-10-CM

## 2022-04-27 DIAGNOSIS — F32A Depression, unspecified: Secondary | ICD-10-CM

## 2022-04-27 DIAGNOSIS — I10 Essential (primary) hypertension: Secondary | ICD-10-CM

## 2022-04-27 DIAGNOSIS — K219 Gastro-esophageal reflux disease without esophagitis: Secondary | ICD-10-CM

## 2022-04-27 DIAGNOSIS — M7989 Other specified soft tissue disorders: Secondary | ICD-10-CM

## 2022-04-27 DIAGNOSIS — J302 Other seasonal allergic rhinitis: Secondary | ICD-10-CM

## 2022-04-27 DIAGNOSIS — Z1231 Encounter for screening mammogram for malignant neoplasm of breast: Secondary | ICD-10-CM

## 2022-04-27 DIAGNOSIS — J3089 Other allergic rhinitis: Secondary | ICD-10-CM

## 2022-04-27 DIAGNOSIS — H1010 Acute atopic conjunctivitis, unspecified eye: Secondary | ICD-10-CM

## 2022-04-27 DIAGNOSIS — F419 Anxiety disorder, unspecified: Secondary | ICD-10-CM | POA: Diagnosis not present

## 2022-04-27 DIAGNOSIS — J4540 Moderate persistent asthma, uncomplicated: Secondary | ICD-10-CM | POA: Diagnosis not present

## 2022-04-27 NOTE — Progress Notes (Signed)
Patient said that she is doing very good and has no new concerns today for provider ?

## 2022-04-27 NOTE — Progress Notes (Signed)
? ?[facility] ? ? ?Follow-up Note ? ?Referring Provider: Dorna Mai, MD ?Primary Provider: Dorna Mai, MD ?Date of Office Visit: 04/27/2022 ? ?Subjective:  ? ?Amy Combs (DOB: 11-08-1961) is a 61 y.o. female who returns to the [facility] on 04/27/2022 in re-evaluation of the following: ? ?HPI: Jahni returns to this clinic in reevaluation of asthma, allergic rhinoconjunctivitis, and reflux.  I last saw her in this clinic on 23 September 2020.  She did visit with our nurse practitioner on 23 October 2021. ? ?She has really done well overall regarding her atopic respiratory tract issue.  She did run out of her Symbicort and because of an insurance issue had to change to Advair at the beginning of the year and this does just not work as well as Symbicort.  When she is using Symbicort on a consistent basis twice a day she really has no significant problems and rarely uses a short acting bronchodilator and she would like to go back to using the Symbicort.  Fortunately, she has not developed any problems as she has gone through this winter and spring that has required the administration of a systemic steroid to treat an asthma exacerbation.   ? ?Her nose is doing quite well using montelukast.   ? ?Her eyes do okay while using antihistamines and some eyedrops. ? ?Her reflux is under very good control as long as she continues on a proton pump inhibitor and an H2 receptor blocker.  If she misses 1 of these medications she gets very bad reflux. ? ?Allergies as of 04/27/2022   ? ?   Reactions  ? Other   ? Other reaction(s): Eye Redness ?Dust, ragweed, grass  ? Lisinopril Swelling  ? ?  ? ?  ?Medication List  ? ? ?Advair HFA 115-21 MCG/ACT inhaler ?Generic drug: fluticasone-salmeterol ?Inhale 2 puffs into the lungs 2 (two) times daily. ?  ?albuterol (2.5 MG/3ML) 0.083% nebulizer solution ?Commonly known as: PROVENTIL ?Take 3 mLs (2.5 mg total) by  nebulization every 4 (four) hours as needed. ?  ?amLODipine 10 MG tablet ?Commonly known as: NORVASC ?Take 1 tablet (10 mg total) by mouth daily. Dx Code: E93 ?  ?Bepotastine Besilate 1.5 % Soln ?Place 1 drop into both eyes 2 (two) times daily. ?  ?chlorthalidone 25 MG tablet ?Commonly known as: HYGROTON ?Take 1 tablet (25 mg total) by mouth daily. Dx Code: Y10 ?  ?cyclobenzaprine 5 MG tablet ?Commonly known as: FLEXERIL ?Take 1 tablet (5 mg total) by mouth 3 (three) times daily as needed for muscle spasms. ?  ?famotidine 40 MG tablet ?Commonly known as: PEPCID ?Take 1 tablet (40 mg total) by mouth daily. ?  ?latanoprost 0.005 % ophthalmic solution ?Commonly known as: XALATAN ?Place 1 drop into both eyes at bedtime. ?  ?montelukast 10 MG tablet ?Commonly known as: SINGULAIR ?Take 10 mg by mouth at bedtime as needed (asthmatic symptoms). ?  ?OMEGA 3-6-9 COMPLEX PO ?Take 1 capsule by mouth in the morning. [place] Omega 50+ with CoQ10 ?  ?pantoprazole 40 MG tablet ?Commonly known as: PROTONIX ?TAKE 1 TABLET BY MOUTH EVERY DAY ?  ?PARoxetine 20 MG tablet ?Commonly known as: Paxil ?Take 1 tablet (20 mg total) by mouth daily. ?  ?Simbrinza 1-0.2 % Susp ?Generic drug: Brinzolamide-Brimonidine ?Place 1 drop into both eyes 2 (two) times daily. ?  ?Symbicort 160-4.5 MCG/ACT inhaler ?Generic drug: budesonide-formoterol ?Inhale 2 puffs into the lungs 2 (two) times daily.  TAKE 2 PUFFS BY MOUTH TWICE A DAY ?  ?telmisartan 80 MG tablet ?Commonly known as: Micardis ?Take 1 tablet (80 mg total) by mouth daily. ?  ?valACYclovir 1000 MG tablet ?Commonly known as: VALTREX ?TAKE 1 TABLET BY MOUTH TWICE A DAY ?  ?vitamin B-12 1000 MCG tablet ?Commonly known as: CYANOCOBALAMIN ?Take 1,000 mcg by mouth in the morning. ?  ? ?Past Medical History:  ?Diagnosis Date  ? Allergy   ? Ankle swelling   ? Asthma   ? GERD (gastroesophageal reflux disease)   ? Hypertension   ? Primary open angle glaucoma (POAG) of right eye, severe stage   ? ? ?Past  Surgical History:  ?Procedure Laterality Date  ? ABDOMINAL SURGERY  1998  ? mesh placed for scar tissue  ? [place]  ? COLONOSCOPY  06/06/2020  ? TUBAL LIGATION  1990  ? ? ?Review of systems negative except as noted in HPI / PMHx or noted below: ? ?Review of Systems  ?Constitutional: Negative.   ?HENT: Negative.    ?Eyes: Negative.   ?Respiratory: Negative.    ?Cardiovascular: Negative.   ?Gastrointestinal: Negative.   ?Genitourinary: Negative.   ?Musculoskeletal: Negative.   ?Skin: Negative.   ?Neurological: Negative.   ?Endo/Heme/Allergies: Negative.   ?Psychiatric/Behavioral: Negative.    ? ? ?Objective:  ? ?Vitals:  ? 04/27/22 1127  ?BP: 130/70  ?Pulse: 80  ?Resp: 18  ?Temp: 98.3 ?F (36.8 ?C)  ?SpO2: 96%  ? ?Height: 5' 7.25" (170.8 cm)  ?Weight: 243 lb 12.8 oz (110.6 kg)  ? ?Physical Exam ?Constitutional:   ?   Appearance: She is not diaphoretic.  ?HENT:  ?   Head: Normocephalic.  ?   Right Ear: Tympanic membrane, ear canal and external ear normal.  ?   Left Ear: Tympanic membrane, ear canal and external ear normal.  ?   Nose: Nose normal. No mucosal edema or rhinorrhea.  ?   Mouth/Throat:  ?   Pharynx: Uvula midline. No oropharyngeal exudate.  ?Eyes:  ?   Conjunctiva/sclera: Conjunctivae normal.  ?Neck:  ?   Thyroid: No thyromegaly.  ?   Trachea: Trachea normal. No tracheal tenderness or tracheal deviation.  ?Cardiovascular:  ?   Rate and Rhythm: Normal rate and regular rhythm.  ?   Heart sounds: Normal heart sounds, S1 normal and S2 normal. No murmur heard. ?Pulmonary:  ?   Effort: No respiratory distress.  ?   Breath sounds: Normal breath sounds. No stridor. No wheezing or rales.  ?Lymphadenopathy:  ?   Head:  ?   Right side of head: No tonsillar adenopathy.  ?   Left side of head: No tonsillar adenopathy.  ?   Cervical: No cervical adenopathy.  ?Skin: ?   Findings: No erythema or rash.  ?   Nails: There is no clubbing.  ?Neurological:  ?   Mental Status: She is alert.   ? ? ?Diagnostics:  ?  ?Spirometry was performed and demonstrated an FEV1 of 1.60 at 67 % of predicted. ? ?Assessment and Plan:  ? ?1. Not well controlled moderate persistent asthma   ?2. Seasonal and perennial allergic rhinitis   ?3. Seasonal allergic conjunctivitis   ?4. LPRD (laryngopharyngeal reflux disease)   ? ? ?1. Continue GENERIC Symbicort 160 -2 inhalations twice a day  ? ?2. Continue Montelukast 10 mg daily ? ?3. Continue Pantoprazole 40 mg in the morning + famotidine 40  mg in evening ? ?4. Continue ProAir HFA or albuterol  nebulization every 4-6 hours if needed ? ?5. Continue OTC antihistamine if needed.   ? ?6. Continue Bepreve eye drops if needed ? ?7. "Action plan" for asthma flare up: ? ? A. continue Symbicort 160 2 inhalations twice a day ? B. add Qvar 80 3 inhalations 3 times a day ? C. use ProAir HFA or albuterol nebulization if needed ? ?8. Return to clinic in 6 months or earlier if problem ? ?Elowyn will restart her Symbicort and we will attempt to get her a generic form of Symbicort which hopefully her insurance company will pay for with a reasonable co-pay.  She will continue on leukotriene modifier as well and she will continue to aggressively treat her reflux and she has a selection of agents that she can utilize should they be required.  Assuming she does well with this plan I will see her back in this clinic in 6 months or earlier if there is a problem. ? ?[name], MD ?Allergy / Immunology ?[facility] ?

## 2022-04-27 NOTE — Patient Instructions (Addendum)
?  1. Continue GENERIC Symbicort 160 -2 inhalations twice a day  ? ?2. Continue Montelukast 10 mg daily ? ?3. Continue Pantoprazole 40 mg in the morning + famotidine 40  mg in evening ? ?4. Continue ProAir HFA or albuterol nebulization every 4-6 hours if needed ? ?5. Continue OTC antihistamine if needed.   ? ?6. Continue Bepreve eye drops if needed ? ?7. "Action plan" for asthma flare up: ? ? A. continue Symbicort 160 2 inhalations twice a day ? B. add Qvar 80 3 inhalations 3 times a day ? C. use ProAir HFA or albuterol nebulization if needed ? ?8. Return to clinic in 6 months or earlier if problem ? ?

## 2022-04-27 NOTE — Progress Notes (Signed)
? ?Established Patient Office Visit ? ?Subjective   ? ?Patient ID: Amy Combs, female    DOB: [birth year]  Age: 61 y.o. MRN: [id number] ? ?CC:  ?Chief Complaint  ?Patient presents with  ? Follow-up  ? ? ?HPI ?Amy Combs presents for routine follow up of chronic med issues including hypertension and anxiety/depression. Patient denies acute complaints or concerns.  ? ? ?Outpatient Encounter Medications as of 04/27/2022  ?Medication Sig  ? ADVAIR HFA 115-21 MCG/ACT inhaler Inhale 2 puffs into the lungs 2 (two) times daily.  ? albuterol (PROVENTIL) (2.5 MG/3ML) 0.083% nebulizer solution Take 3 mLs (2.5 mg total) by nebulization every 4 (four) hours as needed.  ? amLODipine (NORVASC) 10 MG tablet Take 1 tablet (10 mg total) by mouth daily. Dx Code: Q25  ? Bepotastine Besilate 1.5 % SOLN Place 1 drop into both eyes 2 (two) times daily.  ? chlorthalidone (HYGROTON) 25 MG tablet Take 1 tablet (25 mg total) by mouth daily. Dx Code: Z56  ? cyclobenzaprine (FLEXERIL) 5 MG tablet Take 1 tablet (5 mg total) by mouth 3 (three) times daily as needed for muscle spasms.  ? famotidine (PEPCID) 40 MG tablet Take 1 tablet (40 mg total) by mouth daily.  ? latanoprost (XALATAN) 0.005 % ophthalmic solution Place 1 drop into both eyes at bedtime.  ? montelukast (SINGULAIR) 10 MG tablet Take 10 mg by mouth at bedtime as needed (asthmatic symptoms).  ? Omega 3-6-9 Fatty Acids (OMEGA 3-6-9 COMPLEX PO) Take 1 capsule by mouth in the morning. [place] Omega 50+ with CoQ10  ? pantoprazole (PROTONIX) 40 MG tablet TAKE 1 TABLET BY MOUTH EVERY DAY  ? PARoxetine (PAXIL) 20 MG tablet Take 1 tablet (20 mg total) by mouth daily.  ? SIMBRINZA 1-0.2 % SUSP Place 1 drop into both eyes 2 (two) times daily.  ? SYMBICORT 160-4.5 MCG/ACT inhaler Inhale 2 puffs into the lungs 2 (two) times daily. TAKE 2 PUFFS BY MOUTH TWICE A DAY  ? telmisartan (MICARDIS) 80 MG tablet Take 1 tablet (80 mg total) by mouth daily.  ? valACYclovir (VALTREX) 1000 MG tablet  TAKE 1 TABLET BY MOUTH TWICE A DAY  ? vitamin B-12 (CYANOCOBALAMIN) 1000 MCG tablet Take 1,000 mcg by mouth in the morning.  ? ?No facility-administered encounter medications on file as of 04/27/2022.  ? ? ?Past Medical History:  ?Diagnosis Date  ? Allergy   ? Ankle swelling   ? Asthma   ? GERD (gastroesophageal reflux disease)   ? Hypertension   ? Primary open angle glaucoma (POAG) of right eye, severe stage   ? ? ?Past Surgical History:  ?Procedure Laterality Date  ? ABDOMINAL SURGERY  1998  ? mesh placed for scar tissue  ? [place]  ? COLONOSCOPY  06/06/2020  ? TUBAL LIGATION  1990  ? ? ?Family History  ?Problem Relation Age of Onset  ? High blood pressure Mother   ? Cataracts Mother   ? Glaucoma Mother   ? High blood pressure Father   ? Cataracts Maternal Grandfather   ? Glaucoma Maternal Grandfather   ? Breast cancer Paternal Aunt   ? Colon cancer Neg Hx   ? Esophageal cancer Neg Hx   ? Rectal cancer Neg Hx   ? Stomach cancer Neg Hx   ? Inflammatory bowel disease Neg Hx   ? Liver disease Neg Hx   ? Pancreatic cancer Neg Hx   ? ? ?Social History  ? ?Socioeconomic History  ? Marital status:  Single  ?  Spouse name: Not on file  ? Number of children: Not on file  ? Years of education: Not on file  ? Highest education level: Not on file  ?Occupational History  ? Not on file  ?Tobacco Use  ? Smoking status: Never  ? Smokeless tobacco: Never  ?Vaping Use  ? Vaping Use: Never used  ?Substance and Sexual Activity  ? Alcohol use: Yes  ?  Comment: occasional  ? Drug use: Not Currently  ?  Types: Marijuana  ? Sexual activity: Not on file  ?Other Topics Concern  ? Not on file  ?Social History Narrative  ? Not on file  ? ?Social Determinants of Health  ? ?Financial Resource Strain: Not on file  ?Food Insecurity: Not on file  ?Transportation Needs: Not on file  ?Physical Activity: Not on file  ?Stress: Not on file  ?Social Connections: Not on file  ?Intimate Partner Violence: Not on file  ? ? ?Review of  Systems  ?All other systems reviewed and are negative. ? ?  ? ? ?Objective   ? ?BP 121/80   Pulse 77   Temp 98.1 ?F (36.7 ?C) (Oral)   Resp 16   Wt 240 lb (108.9 kg)   LMP 02/27/2016   SpO2 96%   BMI 37.31 kg/m?  ? ?Physical Exam ?Vitals and nursing note reviewed.  ?Constitutional:   ?   General: She is not in acute distress. ?Cardiovascular:  ?   Rate and Rhythm: Normal rate and regular rhythm.  ?Pulmonary:  ?   Effort: Pulmonary effort is normal.  ?   Breath sounds: Normal breath sounds.  ?Abdominal:  ?   Palpations: Abdomen is soft.  ?   Tenderness: There is no abdominal tenderness.  ?Neurological:  ?   General: No focal deficit present.  ?   Mental Status: She is alert and oriented to person, place, and time.  ? ? ? ?  ? ?Assessment & Plan:  ? ?1. Essential hypertension ?Appears stable with present management. Continue and monitor ? ?2. Anxiety and depression ?Appears stable with present management. Continue  ? ?3. Encounter for screening mammogram for malignant neoplasm of breast ?Referral for mammogram ?- MM DIGITAL SCREENING BILATERAL; Future ? ? ? ?No follow-ups on file.  ? ?Becky Sax, MD ? ? ?

## 2022-04-27 NOTE — Telephone Encounter (Signed)
Patient called and was seen this morning and needs to have generic Symbicort 90 day supply called into [facility] on [address]. [phone]. ?

## 2022-04-28 ENCOUNTER — Encounter: Payer: Self-pay | Admitting: Allergy and Immunology

## 2022-04-28 ENCOUNTER — Other Ambulatory Visit: Payer: Self-pay | Admitting: Allergy and Immunology

## 2022-04-28 MED ORDER — FAMOTIDINE 40 MG PO TABS
40.0000 mg | ORAL_TABLET | Freq: Every day | ORAL | 1 refills | Status: DC
Start: 2022-04-28 — End: 2023-02-15

## 2022-04-28 MED ORDER — PANTOPRAZOLE SODIUM 40 MG PO TBEC: 40.0000 mg | DELAYED_RELEASE_TABLET | Freq: Every day | ORAL | 1 refills | Status: DC

## 2022-04-28 MED ORDER — ALBUTEROL SULFATE (2.5 MG/3ML) 0.083% IN NEBU: 2.5000 mg | INHALATION_SOLUTION | RESPIRATORY_TRACT | 1 refills | Status: DC | PRN

## 2022-04-28 MED ORDER — SYMBICORT 160-4.5 MCG/ACT IN AERO: 2.0000 | INHALATION_SPRAY | Freq: Two times a day (BID) | RESPIRATORY_TRACT | 1 refills | Status: DC

## 2022-04-28 MED ORDER — MONTELUKAST SODIUM 10 MG PO TABS: 10.0000 mg | ORAL_TABLET | Freq: Every evening | ORAL | 1 refills | Status: DC

## 2022-04-28 NOTE — Telephone Encounter (Signed)
In patient's chart I see that Symbicort medication was sent in on 04/28/22.  ?

## 2022-04-29 ENCOUNTER — Other Ambulatory Visit: Payer: Self-pay | Admitting: Family Medicine

## 2022-05-21 ENCOUNTER — Other Ambulatory Visit: Payer: Self-pay | Admitting: Allergy and Immunology

## 2022-05-24 ENCOUNTER — Other Ambulatory Visit: Payer: Self-pay | Admitting: Family Medicine

## 2022-05-24 DIAGNOSIS — I10 Essential (primary) hypertension: Secondary | ICD-10-CM

## 2022-07-08 ENCOUNTER — Ambulatory Visit: Payer: Self-pay

## 2022-07-08 NOTE — Telephone Encounter (Signed)
  Chief Complaint: back pain Symptoms: back pain on lower R side and radiates into r upper thigh, 9/10 Frequency: 1 week Pertinent Negatives: NA Disposition: '[]'$ ED /'[]'$ Urgent Care (no appt availability in office) / '[]'$ Appointment(In office/virtual)/ '[]'$  [facility]/ '[]'$ Home Care/ '[]'$ Refused Recommended Disposition /'[x]'$  [facility]/ '[]'$  Follow-up with PCP Additional Notes: pt went and got some Back Aide from [place] and said that has helped some. Advised no appts available until 07/19/22. Offered pt going to [facility] and provided her with address and time for 07/12/22. Pt states she will try to go there.   Summary: back pain   Pt called with back pain / she wanted an appt for this week but no appt available / please advise       Reason for Disposition  [1] Pain radiates into the thigh or further down the leg AND [2] one leg  Answer Assessment - Initial Assessment Questions 1. ONSET: "When did the pain begin?"      1 week  2. LOCATION: "Where does it hurt?" (upper, mid or lower back)     Lower back on right side 3. SEVERITY: "How bad is the pain?"  (e.g., Scale 1-10; mild, moderate, or severe)   - MILD (1-3): Doesn't interfere with normal activities.    - MODERATE (4-7): Interferes with normal activities or awakens from sleep.    - SEVERE (8-10): Excruciating pain, unable to do any normal activities.      9 4. PATTERN: "Is the pain constant?" (e.g., yes, no; constant, intermittent)      Comes and goes  5. RADIATION: "Does the pain shoot into your legs or somewhere else?"     R upper thigh 8. MEDICINES: "What have you taken so far for the pain?" (e.g., nothing, acetaminophen, NSAIDS)     Back aide  10. OTHER SYMPTOMS: "Do you have any other symptoms?" (e.g., fever, abdomen pain, burning with urination, blood in urine)  Protocols used: Back Pain-A-AH

## 2022-07-08 NOTE — Telephone Encounter (Signed)
Summary: back pain   Pt called with back pain / she wanted an appt for this week but no appt available / please advise     Called pt and LM on VM to call back to office to discuss back pain.

## 2022-07-12 ENCOUNTER — Other Ambulatory Visit: Payer: Self-pay | Admitting: Internal Medicine

## 2022-07-12 DIAGNOSIS — R21 Rash and other nonspecific skin eruption: Secondary | ICD-10-CM

## 2022-07-26 ENCOUNTER — Ambulatory Visit
Admission: RE | Admit: 2022-07-26 | Discharge: 2022-07-26 | Disposition: A | Discharge status: Home / Self Care | Payer: No Typology Code available for payment source | Source: Ambulatory Visit | Attending: Family Medicine | Admitting: Family Medicine

## 2022-07-26 DIAGNOSIS — Z1231 Encounter for screening mammogram for malignant neoplasm of breast: Secondary | ICD-10-CM

## 2022-07-28 ENCOUNTER — Encounter: Payer: Self-pay | Admitting: Family Medicine

## 2022-07-28 ENCOUNTER — Ambulatory Visit (INDEPENDENT_AMBULATORY_CARE_PROVIDER_SITE_OTHER): Payer: No Typology Code available for payment source | Admitting: Family Medicine

## 2022-07-28 VITALS — BP 143/80 | HR 75 | Temp 98.1°F | Resp 16 | Wt 243.0 lb

## 2022-07-28 DIAGNOSIS — I10 Essential (primary) hypertension: Secondary | ICD-10-CM

## 2022-07-28 DIAGNOSIS — F32A Depression, unspecified: Secondary | ICD-10-CM | POA: Diagnosis not present

## 2022-07-28 DIAGNOSIS — F419 Anxiety disorder, unspecified: Secondary | ICD-10-CM

## 2022-07-28 MED ORDER — AMLODIPINE BESYLATE 10 MG PO TABS: 10.0000 mg | ORAL_TABLET | Freq: Every day | ORAL | 1 refills | Status: DC

## 2022-07-28 MED ORDER — PAROXETINE HCL 10 MG PO TABS: 10.0000 mg | ORAL_TABLET | Freq: Every day | ORAL | 0 refills | Status: DC

## 2022-07-29 NOTE — Progress Notes (Signed)
Established Patient Office Visit  Subjective    Patient ID: Amy Combs, female    DOB: 05/28/1961  Age: 61 y.o. MRN: [id number]  CC:  Chief Complaint  Patient presents with   Follow-up   Hypertension    HPI Arlean Thies presents for follow up of hypertension and anxiety and depression. Patient denies acute complaints or concerns.    Outpatient Encounter Medications as of 07/28/2022  Medication Sig   PARoxetine (PAXIL) 10 MG tablet Take 1 tablet (10 mg total) by mouth daily.   ADVAIR HFA 115-21 MCG/ACT inhaler Inhale 2 puffs into the lungs 2 (two) times daily.   amLODipine (NORVASC) 10 MG tablet Take 1 tablet (10 mg total) by mouth daily. Dx Code: I10   Bepotastine Besilate 1.5 % SOLN Place 1 drop into both eyes 2 (two) times daily.   chlorthalidone (HYGROTON) 25 MG tablet Take 1 tablet (25 mg total) by mouth daily. Dx Code: I10   cyclobenzaprine (FLEXERIL) 5 MG tablet Take 1 tablet (5 mg total) by mouth 3 (three) times daily as needed for muscle spasms.   famotidine (PEPCID) 40 MG tablet Take 1 tablet (40 mg total) by mouth daily.   latanoprost (XALATAN) 0.005 % ophthalmic solution Place 1 drop into both eyes at bedtime.   levalbuterol (XOPENEX) 1.25 MG/3ML nebulizer solution Take 1.25 mg by nebulization every 4 (four) hours as needed for wheezing.   montelukast (SINGULAIR) 10 MG tablet Take 1 tablet (10 mg total) by mouth at bedtime.   Omega 3-6-9 Fatty Acids (OMEGA 3-6-9 COMPLEX PO) Take 1 capsule by mouth in the morning. [place] Omega 50+ with CoQ10   pantoprazole (PROTONIX) 40 MG tablet Take 1 tablet (40 mg total) by mouth daily.   PARoxetine (PAXIL) 20 MG tablet Take 1 tablet (20 mg total) by mouth daily.   SIMBRINZA 1-0.2 % SUSP Place 1 drop into both eyes 2 (two) times daily.   SYMBICORT 160-4.5 MCG/ACT inhaler Inhale 2 puffs into the lungs 2 (two) times daily. TAKE 2 PUFFS BY MOUTH TWICE A DAY   telmisartan (MICARDIS) 80 MG tablet TAKE 1 TABLET BY MOUTH EVERY DAY    valACYclovir (VALTREX) 1000 MG tablet TAKE 1 TABLET BY MOUTH TWICE A DAY   vitamin B-12 (CYANOCOBALAMIN) 1000 MCG tablet Take 1,000 mcg by mouth in the morning.   [DISCONTINUED] amLODipine (NORVASC) 10 MG tablet Take 1 tablet (10 mg total) by mouth daily. Dx Code: R48   No facility-administered encounter medications on file as of 07/28/2022.    Past Medical History:  Diagnosis Date   Allergy    Ankle swelling    Asthma    GERD (gastroesophageal reflux disease)    Hypertension    Primary open angle glaucoma (POAG) of right eye, severe stage     Past Surgical History:  Procedure Laterality Date   ABDOMINAL SURGERY  1998   mesh placed for scar tissue   CESAREAN SECTION CLASSICAL  1990   COLONOSCOPY  06/06/2020   TUBAL LIGATION  1990    Family History  Problem Relation Age of Onset   High blood pressure Mother    Cataracts Mother    Glaucoma Mother    High blood pressure Father    Cataracts Maternal Grandfather    Glaucoma Maternal Grandfather    Breast cancer Paternal Aunt    Colon cancer Neg Hx    Esophageal cancer Neg Hx    Rectal cancer Neg Hx    Stomach cancer Neg Hx  Inflammatory bowel disease Neg Hx    Liver disease Neg Hx    Pancreatic cancer Neg Hx     Social History   Socioeconomic History   Marital status: Single    Spouse name: Not on file   Number of children: Not on file   Years of education: Not on file   Highest education level: Not on file  Occupational History   Not on file  Tobacco Use   Smoking status: Never   Smokeless tobacco: Never  Vaping Use   Vaping Use: Never used  Substance and Sexual Activity   Alcohol use: Yes    Comment: occasional   Drug use: Not Currently    Types: Marijuana   Sexual activity: Not on file  Other Topics Concern   Not on file  Social History Narrative   Not on file   Social Determinants of Health   Financial Resource Strain: Not on file  Food Insecurity: Not on file  Transportation Needs: Not on  file  Physical Activity: Not on file  Stress: Not on file  Social Connections: Not on file  Intimate Partner Violence: Not on file    Review of Systems  All other systems reviewed and are negative.       Objective    BP (!) 143/80   Pulse 75   Temp 98.1 F (36.7 C) (Oral)   Resp 16   Wt 243 lb (110.2 kg)   LMP 02/27/2016   SpO2 96%   BMI 37.78 kg/m   Physical Exam Vitals and nursing note reviewed.  Constitutional:      General: She is not in acute distress. Cardiovascular:     Rate and Rhythm: Normal rate and regular rhythm.  Pulmonary:     Effort: Pulmonary effort is normal.     Breath sounds: Normal breath sounds.  Abdominal:     Palpations: Abdomen is soft.     Tenderness: There is no abdominal tenderness.  Neurological:     General: No focal deficit present.     Mental Status: She is alert and oriented to person, place, and time.         Assessment & Plan:   1. Essential hypertension Slightly elevated reading. Amlodipine 10 mg refilled. Continue and monitor  2. Anxiety and depression Paxil 10 mg refilled.      Return for follow up back pain, anxiety, and hypertension.   Becky Sax, MD

## 2022-08-02 ENCOUNTER — Other Ambulatory Visit: Payer: Self-pay | Admitting: Internal Medicine

## 2022-08-02 DIAGNOSIS — R21 Rash and other nonspecific skin eruption: Secondary | ICD-10-CM

## 2022-08-05 ENCOUNTER — Encounter: Payer: Self-pay | Admitting: Emergency Medicine

## 2022-08-05 ENCOUNTER — Ambulatory Visit
Admission: EM | Admit: 2022-08-05 | Discharge: 2022-08-05 | Disposition: A | Discharge status: Home / Self Care | Payer: No Typology Code available for payment source | Attending: Internal Medicine | Admitting: Internal Medicine

## 2022-08-05 DIAGNOSIS — L02219 Cutaneous abscess of trunk, unspecified: Secondary | ICD-10-CM | POA: Diagnosis not present

## 2022-08-05 MED ORDER — DOXYCYCLINE HYCLATE 100 MG PO CAPS: 100.0000 mg | ORAL_CAPSULE | Freq: Two times a day (BID) | ORAL | 0 refills | Status: DC

## 2022-08-05 NOTE — Discharge Instructions (Signed)
Your abscess has been drained today.  An antibiotic has been prescribed that you should take with food.  It can also make you more prone to sunburn so please wear sunscreen if you are to be outside.  Please follow-up if symptoms persist or worsen.

## 2022-08-05 NOTE — ED Provider Notes (Signed)
[facility]    CSN: [id number] Arrival date & time: 08/05/22  1120      History   Chief Complaint Chief Complaint  Patient presents with   Abscess    HPI Sunday Amy Combs is a 61 y.o. female.   Patient presents with abscess to right side of abdomen lateral to breast that has been present for about 4 to 5 days.  Patient denies any drainage from the area.  Patient reports history of similar in the same area.  She states that it improved on its own in the past.  Denies fever, body aches, chills.   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      Home Medications    Prior to Admission medications   Medication Sig Start Date End Date Taking? Authorizing Provider  doxycycline (VIBRAMYCIN) 100 MG capsule Take 1 capsule (100 mg total) by mouth 2 (two) times daily. 08/05/22  Yes Genavie Boettger, [place], [place]  ADVAIR [facility] 115-21 MCG/ACT inhaler  Inhale 2 puffs into the lungs 2 (two) times daily. 02/03/22   Kozlow, Donnamarie Poag, MD  amLODipine (NORVASC) 10 MG tablet Take 1 tablet (10 mg total) by mouth daily. Dx Code: I10 07/28/22   Dorna Mai, MD  Bepotastine Besilate 1.5 % SOLN Place 1 drop into both eyes 2 (two) times daily. 09/25/21   [provider]  chlorthalidone (HYGROTON) 25 MG tablet Take 1 tablet (25 mg total) by mouth daily. Dx Code: I10 09/29/21   Dorna Mai, MD  cyclobenzaprine (FLEXERIL) 5 MG tablet Take 1 tablet (5 mg total) by mouth 3 (three) times daily as needed for muscle spasms. 11/27/21   Dorna Mai, MD  famotidine (PEPCID) 40 MG tablet Take 1 tablet (40 mg total) by mouth daily. 04/28/22   Kozlow, Donnamarie Poag, MD  latanoprost (XALATAN) 0.005 % ophthalmic solution Place 1 drop into both eyes at bedtime. 12/05/15   [provider]  levalbuterol Penne Lash) 1.25 MG/3ML nebulizer solution Take 1.25 mg by nebulization every 4 (four) hours as needed for wheezing. 04/28/22   Kozlow, Donnamarie Poag, MD  montelukast (SINGULAIR) 10 MG tablet Take 1 tablet (10 mg total) by mouth at bedtime. 04/28/22   Kozlow, Donnamarie Poag, MD  Omega 3-6-9 Fatty Acids (OMEGA 3-6-9 COMPLEX PO) Take 1 capsule by mouth in the morning. Irineo Axon 50+ with CoQ10    [provider]  pantoprazole (PROTONIX) 40 MG tablet Take 1 tablet (40 mg total) by mouth daily. 04/28/22   Kozlow, Donnamarie Poag, MD  PARoxetine (PAXIL) 10 MG tablet Take 1 tablet (10 mg total) by mouth daily. 07/28/22   Dorna Mai, MD  PARoxetine (PAXIL) 20 MG tablet Take 1 tablet (20 mg total) by mouth daily. 10/22/21   Dorna Mai, MD  [facility] 1-0.2 % SUSP Place 1 drop into both eyes 2 (two) times daily. 11/05/19   [provider]  SYMBICORT 160-4.5 MCG/ACT inhaler Inhale 2 puffs into the lungs 2 (two) times daily. TAKE 2 PUFFS BY MOUTH TWICE A DAY 04/28/22   Kozlow, Donnamarie Poag, MD  telmisartan (MICARDIS) 80 MG tablet TAKE 1 TABLET BY MOUTH EVERY DAY 05/24/22   Dorna Mai, MD   valACYclovir (VALTREX) 1000 MG tablet TAKE 1 TABLET BY MOUTH TWICE A DAY 05/12/21   Nicolette Bang, MD  vitamin B-12 (CYANOCOBALAMIN) 1000 MCG tablet Take 1,000 mcg by mouth in the morning.    [provider]    Family History Family History  Problem Relation Age of Onset   High blood pressure Mother    Cataracts Mother    Glaucoma Mother    High blood pressure Father    Cataracts Maternal Grandfather    Glaucoma Maternal Grandfather    Breast cancer Paternal Aunt    Colon cancer Neg Hx    Esophageal cancer Neg Hx    Rectal cancer Neg Hx    Stomach cancer Neg Hx    Inflammatory bowel disease Neg Hx    Liver disease Neg Hx    Pancreatic cancer Neg Hx     Social History Social History   Tobacco Use   Smoking status: Never   Smokeless tobacco: Never  Vaping Use   Vaping Use: Never used  Substance Use Topics   Alcohol use: Yes    Comment: occasional   Drug use: Not Currently    Types: Marijuana     Allergies   Other and Lisinopril   Review of Systems Review of Systems Per HPI  Physical Exam Triage Vital Signs ED Triage Vitals  Enc Vitals Group     BP 08/05/22 1235 (!) 151/84     Pulse Rate 08/05/22 1235 82     Resp 08/05/22 1235 17     Temp 08/05/22 1235 98.3 F (36.8 C)     Temp src --      SpO2 08/05/22 1235 94 %     Weight --      Height --      Head Circumference --      Peak Flow --      Pain Score 08/05/22 1234 10     Pain Loc --      Pain Edu? --      Excl. in [place]? --    No data found.  Updated Vital Signs BP (!) 151/84   Pulse 82   Temp 98.3 F (36.8 C)   Resp 17   LMP 02/27/2016   SpO2 94%   Visual Acuity Right Eye Distance:   Left Eye Distance:   Bilateral Distance:    Right Eye Near:   Left Eye Near:    Bilateral Near:     Physical Exam Constitutional:      General: She is not in acute distress.    Appearance: Normal appearance. She is not toxic-appearing or diaphoretic.  HENT:     Head:  Normocephalic and atraumatic.  Eyes:     Extraocular Movements: Extraocular movements intact.     Conjunctiva/sclera: Conjunctivae normal.  Pulmonary:     Effort: Pulmonary effort is normal.  Skin:    Comments: Approximately 2.5 cm in diameter abscess present to right lateral side directly beneath the axilla.  Neurological:     General: No focal deficit present.     Mental Status: She is alert and oriented to person, place, and time. Mental status is at baseline.  Psychiatric:        Mood and Affect: Mood normal.        Behavior: Behavior normal.        Thought Content: Thought content normal.        Judgment: Judgment normal.      UC Treatments / Results  Labs (all labs ordered are listed, but only abnormal results are displayed) Labs Reviewed - No data to display  EKG   Radiology No results found.  Procedures Incision and Drainage  Date/Time: 08/05/2022 1:18 PM  Performed by: Teodora Medici, FNP Authorized by: Teodora Medici, FNP   Consent:    Consent obtained:  Verbal   Consent given by:  Patient   Risks, benefits, and alternatives were discussed: yes     Risks discussed:  Bleeding, incomplete drainage and pain   Alternatives discussed:  No treatment, delayed treatment and alternative treatment Universal protocol:    Procedure explained and questions answered to patient or proxy's satisfaction: yes     Immediately prior to procedure, a time out was called: yes     Patient identity confirmed:  Verbally with patient and arm band Location:    Type:  Abscess   Size:  2.5 cm   Location:  Trunk   Trunk location:  Abdomen (right side) Pre-procedure details:    Skin preparation:  Chlorhexidine with alcohol Anesthesia:    Anesthesia method:  Local infiltration   Local anesthetic:  Lidocaine 1% w/o epi Procedure type:    Complexity:  Simple Procedure details:    Incision types:  Stab incision   Wound management:  Probed and deloculated   Drainage:  Purulent    Drainage amount:  Copious   Wound treatment:  Wound left open   Packing materials:  None Post-procedure details:    Procedure completion:  Tolerated well, no immediate complications (nonadherent dressing applied.)  (including critical care time)  Medications Ordered in UC Medications - No data to display  Initial Impression / Assessment and Plan / UC Course  I have reviewed the triage vital signs and the nursing notes.  Pertinent labs & imaging results that were available during my care of the patient were reviewed by me and considered in my medical decision making (see chart for details).     Abscess to trunk. No breast involvement. I&D completed.  See procedure note.  Will prescribe doxycycline.  Advised patient to take this medication with food.  Patient to follow-up if symptoms persist or worsen.  Discussed wound care.  Patient verbalized understanding and was agreeable with plan. Final Clinical Impressions(s) / UC Diagnoses   Final diagnoses:  Abscess, trunk     Discharge Instructions      Your abscess has been drained today.  An antibiotic has been prescribed that you should take with food.  It can also make you more prone to sunburn so please wear sunscreen if you are to be outside.  Please follow-up if symptoms persist or worsen.    ED Prescriptions  Medication Sig Dispense Auth. Provider   doxycycline (VIBRAMYCIN) 100 MG capsule Take 1 capsule (100 mg total) by mouth 2 (two) times daily. 20 capsule Teodora Medici, [place]      PDMP not reviewed this encounter.   Teodora Medici, [place] 08/05/22 1319

## 2022-08-05 NOTE — ED Triage Notes (Signed)
Pt is present today with concerns for an abscess under right breast. Pt states that she noticed it Friday

## 2022-08-31 ENCOUNTER — Encounter: Payer: Self-pay | Admitting: Family Medicine

## 2022-08-31 ENCOUNTER — Ambulatory Visit: Payer: No Typology Code available for payment source | Admitting: Family Medicine

## 2022-08-31 VITALS — BP 122/80 | HR 80 | Temp 97.7°F | Resp 16 | Wt 239.4 lb

## 2022-08-31 DIAGNOSIS — I10 Essential (primary) hypertension: Secondary | ICD-10-CM | POA: Diagnosis not present

## 2022-08-31 DIAGNOSIS — Z6837 Body mass index (BMI) 37.0-37.9, adult: Secondary | ICD-10-CM | POA: Diagnosis not present

## 2022-08-31 DIAGNOSIS — Z23 Encounter for immunization: Secondary | ICD-10-CM

## 2022-08-31 NOTE — Progress Notes (Unsigned)
Established Patient Office Visit  Subjective    Patient ID: Amy Combs, female    DOB: Mar 11, 1961  Age: 61 y.o. MRN: [id number]  CC:  Chief Complaint  Patient presents with   Follow-up   Hypertension   Immunizations    HPI Amy Combs presents for follow up of hypertension. Patient denies acute complaints or concerns.   Outpatient Encounter Medications as of 08/31/2022  Medication Sig   albuterol (PROVENTIL) (2.5 MG/3ML) 0.083% nebulizer solution    albuterol (VENTOLIN HFA) 108 (90 Base) MCG/ACT inhaler    amLODipine (NORVASC) 10 MG tablet Take 1 tablet (10 mg total) by mouth daily. Dx Code: I10   amLODipine (NORVASC) 10 MG tablet Take 1 tablet by mouth daily.   Bepotastine Besilate 1.5 % SOLN Place 1 drop into both eyes 2 (two) times daily.   chlorthalidone (HYGROTON) 25 MG tablet Take 1 tablet (25 mg total) by mouth daily. Dx Code: I10   cyclobenzaprine (FLEXERIL) 5 MG tablet Take 1 tablet (5 mg total) by mouth 3 (three) times daily as needed for muscle spasms.   doxycycline (VIBRAMYCIN) 100 MG capsule Take 1 capsule (100 mg total) by mouth 2 (two) times daily.   famotidine (PEPCID) 20 MG tablet Take 2 tablets by mouth daily.   famotidine (PEPCID) 40 MG tablet Take 1 tablet (40 mg total) by mouth daily.   latanoprost (XALATAN) 0.005 % ophthalmic solution Place 1 drop into both eyes at bedtime.   levalbuterol (XOPENEX) 1.25 MG/3ML nebulizer solution Take 1.25 mg by nebulization every 4 (four) hours as needed for wheezing.   levocetirizine (XYZAL) 2.5 MG/5ML solution PHARMACY ADMINISTERED   levocetirizine (XYZAL) 2.5 MG/5ML solution INHALE THREE DOSES THREE TIMES DAILY DURING FLARE-UP. RINSE, GARGLE, AND SPIT AFTER USE.   levocetirizine (XYZAL) 2.5 MG/5ML solution TAKE 2 PUFFS BY MOUTH TWICE A DAY   montelukast (SINGULAIR) 10 MG tablet Take 1 tablet (10 mg total) by mouth at bedtime.   Omega 3-6-9 Fatty Acids (OMEGA 3-6-9 COMPLEX PO) Take 1 capsule by mouth in the  morning. [place] Omega 50+ with CoQ10   pantoprazole (PROTONIX) 40 MG tablet Take 1 tablet (40 mg total) by mouth daily.   pantoprazole (PROTONIX) 40 MG tablet Take 1 tablet by mouth daily.   PARoxetine (PAXIL) 10 MG tablet Take 1 tablet (10 mg total) by mouth daily.   SIMBRINZA 1-0.2 % SUSP Place 1 drop into both eyes 2 (two) times daily.   SYMBICORT 160-4.5 MCG/ACT inhaler Inhale 2 puffs into the lungs 2 (two) times daily. TAKE 2 PUFFS BY MOUTH TWICE A DAY   telmisartan (MICARDIS) 80 MG tablet TAKE 1 TABLET BY MOUTH EVERY DAY   valACYclovir (VALTREX) 1000 MG tablet TAKE 1 TABLET BY MOUTH TWICE A DAY   vitamin B-12 (CYANOCOBALAMIN) 1000 MCG tablet Take 1,000 mcg by mouth in the morning.   [DISCONTINUED] ADVAIR HFA 115-21 MCG/ACT inhaler Inhale 2 puffs into the lungs 2 (two) times daily.   [DISCONTINUED] PARoxetine (PAXIL) 20 MG tablet Take 1 tablet (20 mg total) by mouth daily.   No facility-administered encounter medications on file as of 08/31/2022.    Past Medical History:  Diagnosis Date   Allergy    Ankle swelling    Asthma    GERD (gastroesophageal reflux disease)    Hypertension    Primary open angle glaucoma (POAG) of right eye, severe stage     Past Surgical History:  Procedure Laterality Date   ABDOMINAL SURGERY  1998   mesh placed for  scar tissue   CESAREAN SECTION CLASSICAL  1990   COLONOSCOPY  06/06/2020   TUBAL LIGATION  1990    Family History  Problem Relation Age of Onset   High blood pressure Mother    Cataracts Mother    Glaucoma Mother    High blood pressure Father    Cataracts Maternal Grandfather    Glaucoma Maternal Grandfather    Breast cancer Paternal Aunt    Colon cancer Neg Hx    Esophageal cancer Neg Hx    Rectal cancer Neg Hx    Stomach cancer Neg Hx    Inflammatory bowel disease Neg Hx    Liver disease Neg Hx    Pancreatic cancer Neg Hx     Social History   Socioeconomic History   Marital status: Single    Spouse name: Not on file    Number of children: Not on file   Years of education: Not on file   Highest education level: Not on file  Occupational History   Not on file  Tobacco Use   Smoking status: Never   Smokeless tobacco: Never  Vaping Use   Vaping Use: Never used  Substance and Sexual Activity   Alcohol use: Yes    Comment: occasional   Drug use: Not Currently    Types: Marijuana   Sexual activity: Not on file  Other Topics Concern   Not on file  Social History Narrative   Not on file   Social Determinants of Health   Financial Resource Strain: Not on file  Food Insecurity: Not on file  Transportation Needs: Not on file  Physical Activity: Not on file  Stress: Not on file  Social Connections: Not on file  Intimate Partner Violence: Not on file    Review of Systems  All other systems reviewed and are negative.       Objective    BP 122/80   Pulse 80   Temp 97.7 F (36.5 C) (Oral)   Resp 16   Wt 239 lb 6.4 oz (108.6 kg)   LMP 02/27/2016   SpO2 94%   BMI 37.22 kg/m   Physical Exam Vitals and nursing note reviewed.  Constitutional:      General: She is not in acute distress.    Appearance: She is obese.  Cardiovascular:     Rate and Rhythm: Normal rate and regular rhythm.  Pulmonary:     Effort: Pulmonary effort is normal.     Breath sounds: Normal breath sounds.  Abdominal:     Palpations: Abdomen is soft.     Tenderness: There is no abdominal tenderness.  Neurological:     General: No focal deficit present.     Mental Status: She is alert and oriented to person, place, and time.     {Labs (Optional):23779}    Assessment & Plan:   1. Essential hypertension Much improved readings. Continue and monitor  2. Class 2 severe obesity due to excess calories with serious comorbidity and body mass index (BMI) of 37.0 to 37.9 in adult [facility]) Discussed dietary and activity options with goal of 3-5lbs/mo wt loss  3. Need for prophylactic vaccination and inoculation against  influenza  - Flu Vaccine QUAD 57moIM (Fluarix, Fluzone & Alfiuria Quad PF)    Return in about 6 months (around 03/01/2023).   WBecky Sax MD

## 2022-10-23 ENCOUNTER — Other Ambulatory Visit: Payer: Self-pay | Admitting: Family Medicine

## 2022-10-25 NOTE — Telephone Encounter (Signed)
Requested Prescriptions  Pending Prescriptions Disp Refills   PARoxetine (PAXIL) 10 MG tablet [Pharmacy Med Name: PAROXETINE HCL 10 MG TABLET] 90 tablet 1    Sig: TAKE 1 TABLET BY MOUTH EVERY DAY     Psychiatry:  Antidepressants - SSRI Passed - 10/23/2022  9:54 AM      Passed - Valid encounter within last 6 months    Recent Outpatient Visits           1 month ago Essential hypertension   [facility] at [facility], MD   2 months ago Essential hypertension   Primary Care at [facility], MD   6 months ago Essential hypertension   Primary Care at [place], MD   10 months ago Left hip pain   Primary Care at [facility], Clyde Canterbury, MD   11 months ago Acute right-sided low back pain without sciatica   Primary Care at [facility], MD       Future Appointments             In 4 months Dorna Mai, MD Primary Care at [facility]

## 2022-10-26 ENCOUNTER — Ambulatory Visit (INDEPENDENT_AMBULATORY_CARE_PROVIDER_SITE_OTHER): Payer: No Typology Code available for payment source | Admitting: Allergy and Immunology

## 2022-10-26 VITALS — BP 134/84 | HR 82 | Temp 98.0°F | Resp 18 | Ht 67.25 in | Wt 242.0 lb

## 2022-10-26 DIAGNOSIS — J3089 Other allergic rhinitis: Secondary | ICD-10-CM | POA: Diagnosis not present

## 2022-10-26 DIAGNOSIS — J302 Other seasonal allergic rhinitis: Secondary | ICD-10-CM

## 2022-10-26 DIAGNOSIS — J4540 Moderate persistent asthma, uncomplicated: Secondary | ICD-10-CM | POA: Diagnosis not present

## 2022-10-26 DIAGNOSIS — H1013 Acute atopic conjunctivitis, bilateral: Secondary | ICD-10-CM | POA: Diagnosis not present

## 2022-10-26 DIAGNOSIS — K219 Gastro-esophageal reflux disease without esophagitis: Secondary | ICD-10-CM | POA: Diagnosis not present

## 2022-10-26 DIAGNOSIS — H1010 Acute atopic conjunctivitis, unspecified eye: Secondary | ICD-10-CM

## 2022-10-26 MED ORDER — QVAR REDIHALER 80 MCG/ACT IN AERB: INHALATION_SPRAY | RESPIRATORY_TRACT | 3 refills | Status: DC

## 2022-10-26 MED ORDER — SYMBICORT 160-4.5 MCG/ACT IN AERO: 2.0000 | INHALATION_SPRAY | Freq: Two times a day (BID) | RESPIRATORY_TRACT | 1 refills | Status: DC

## 2022-10-26 MED ORDER — MONTELUKAST SODIUM 10 MG PO TABS: 10.0000 mg | ORAL_TABLET | Freq: Every evening | ORAL | 1 refills | Status: DC

## 2022-10-26 MED ORDER — BEPOTASTINE BESILATE 1.5 % OP SOLN: 1.0000 [drp] | Freq: Two times a day (BID) | OPHTHALMIC | 5 refills | Status: DC

## 2022-10-26 MED ORDER — ALBUTEROL SULFATE HFA 108 (90 BASE) MCG/ACT IN AERS: 2.0000 | INHALATION_SPRAY | RESPIRATORY_TRACT | 1 refills | Status: DC | PRN

## 2022-10-26 NOTE — Patient Instructions (Addendum)
  1. Continue Symbicort 160 -2 inhalations twice a day   2. Continue Montelukast 10 mg daily  3. Continue Pantoprazole 40 mg in the morning + famotidine 40  mg in evening  4. If Needed:  A. ProAir HFA or albuterol nebulization every 4-6 hours  B. OTC antihistamine  C. Bepreve eye drops I  5. "Action plan" for asthma flare up:   A. continue Symbicort 160 2 inhalations twice a day  B. add Qvar 80 3 inhalations 3 times a day  C. use ProAir HFA or albuterol nebulization if needed  6. Return to clinic in 6 months or earlier if problem
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
## 2022-10-27 ENCOUNTER — Encounter: Payer: Self-pay | Admitting: Allergy and Immunology

## 2022-11-18 ENCOUNTER — Other Ambulatory Visit: Payer: Self-pay | Admitting: Internal Medicine

## 2022-11-18 DIAGNOSIS — R21 Rash and other nonspecific skin eruption: Secondary | ICD-10-CM

## 2022-12-17 ENCOUNTER — Ambulatory Visit: Payer: Self-pay

## 2022-12-17 NOTE — Telephone Encounter (Signed)
  Chief Complaint: COVID Symptoms: cough, congestion, body aches, fatigue, decreased appetite Frequency: since Wednesday Pertinent Negatives: Patient denies SOB or fever Disposition: '[]'$ ED /'[x]'$ Urgent Care (no appt availability in office) / '[]'$ Appointment(In office/virtual)/ '[]'$  [facility]/ '[]'$ Home Care/ '[]'$ Refused Recommended Disposition /'[]'$ [facility]/ '[]'$  Follow-up with PCP Additional Notes: no appts available with practice. Pt doesn't have access to Mychart so scheduled appt with UC today at 1500. Care advice given and pt verbalized understanding.   Summary: Covid+   Patient states that she tested positive for Covid yesterday. Patient is experiencing body aches, cough, congestion, decreased appetite.     Reason for Disposition  [1] HIGH RISK patient (e.g., weak immune system, age > 61 years, obesity with BMI 30 or higher, pregnant, chronic lung disease or other chronic medical condition) AND [2] COVID symptoms (e.g., cough, fever)  (Exceptions: Already seen by PCP and no new or worsening symptoms.)  Answer Assessment - Initial Assessment Questions 1. COVID-19 DIAGNOSIS: "How do you know that you have COVID?" (e.g., positive lab test or self-test, diagnosed by doctor or NP/PA, symptoms after exposure).     Home test yesterday  3. ONSET: "When did the COVID-19 symptoms start?"      Wednesday  5. COUGH: "Do you have a cough?" If Yes, ask: "How bad is the cough?"       yes 6. FEVER: "Do you have a fever?" If Yes, ask: "What is your temperature, how was it measured, and when did it start?"     no 7. RESPIRATORY STATUS: "Describe your breathing?" (e.g., normal; shortness of breath, wheezing, unable to speak)      No SOB  9. OTHER SYMPTOMS: "Do you have any other symptoms?"  (e.g., chills, fatigue, headache, loss of smell or taste, muscle pain, sore throat)     Body aches, cough and congestion, nasal congestion, decreased appetite and fatigue  10. HIGH RISK DISEASE: "Do you  have any chronic medical problems?" (e.g., asthma, heart or lung disease, weak immune system, obesity, etc.)       asthma 11. VACCINE: "Have you had the COVID-19 vaccine?" If Yes, ask: "Which one, how many shots, when did you get it?"       yes  Protocols used: Coronavirus (COVID-19) Diagnosed or Suspected-A-AH

## 2022-12-17 NOTE — Telephone Encounter (Signed)
noted 

## 2022-12-31 ENCOUNTER — Other Ambulatory Visit: Payer: Self-pay | Admitting: Family Medicine

## 2022-12-31 ENCOUNTER — Other Ambulatory Visit: Payer: Self-pay | Admitting: Allergy and Immunology

## 2023-01-17 ENCOUNTER — Other Ambulatory Visit (HOSPITAL_COMMUNITY): Payer: Self-pay

## 2023-01-17 ENCOUNTER — Telehealth: Payer: Self-pay

## 2023-01-17 NOTE — Telephone Encounter (Signed)
PA request received via CMM for Symbicort 160-4.5MCG/ACT aerosol  PA has been submitted to [facility] and is pending determination.  Key: [id number]

## 2023-01-18 ENCOUNTER — Other Ambulatory Visit: Payer: Self-pay | Admitting: *Deleted

## 2023-01-18 MED ORDER — WIXELA INHUB 250-50 MCG/ACT IN AEPB: 1.0000 | INHALATION_SPRAY | Freq: Two times a day (BID) | RESPIRATORY_TRACT | 5 refills | Status: DC

## 2023-01-18 NOTE — Telephone Encounter (Signed)
PA has been DENIED due to:   Your plan only covers this drug when you meet one of these options: A) You have tried other drugs your plan covers (preferred drugs), and they did not work well for you, or B) Your doctor gives [place] a medical reason you cannot take those other drugs. For your plan, you may need to try up to three preferred drugs. We have denied your request because you do not meet any of these conditions. We reviewed the information we had. Your request has been denied. Your doctor can send [place] any new or missing information for [place] to review. The preferred drugs for your plan are: fluticasone-salmeterol (except certain NDCs), Wixela Inhub, BREO ELLIPTA (except certain NDCs). (Requirement: 3 in a class with 3 or more alternatives, 2 in a class with 2 alternatives, or 1 in a class with only 1 alternative.).

## 2023-01-18 NOTE — Telephone Encounter (Signed)
New prescription has been sent in. Called and left a voicemail asking for patient to return call to inform.  

## 2023-01-19 ENCOUNTER — Other Ambulatory Visit: Payer: Self-pay | Admitting: *Deleted

## 2023-01-19 NOTE — Telephone Encounter (Signed)
Called and left a voicemail asking for patient to return call to inform.  

## 2023-01-19 NOTE — Telephone Encounter (Signed)
Patient returned Ashleigh's call. Informed patient Dr. Neldon Mc sent in the [facility] for her as it is covered.   Patient is requesting fluticasone-salmeterol to be prescribed instead as she does not want to do a powdered inhaler. Patient prefers an inhaler that is a mist.   Patient also states her medications must be sent in at a 90 day supply as well.   Please advise  [facility] [place] [id number]  Best contact number for patient: [phone]

## 2023-01-19 NOTE — Telephone Encounter (Signed)
Judithann Sauger is preferred by her insurance.

## 2023-01-19 NOTE — Telephone Encounter (Signed)
Unfortunately the brand and generic HFA is non formulary, do you want [place] to try a PA?

## 2023-01-20 ENCOUNTER — Other Ambulatory Visit: Payer: Self-pay | Admitting: *Deleted

## 2023-01-20 MED ORDER — BREZTRI AEROSPHERE 160-9-4.8 MCG/ACT IN AERO: INHALATION_SPRAY | RESPIRATORY_TRACT | 5 refills | Status: DC

## 2023-01-20 MED ORDER — BREZTRI AEROSPHERE 160-9-4.8 MCG/ACT IN AERO: INHALATION_SPRAY | RESPIRATORY_TRACT | 1 refills | Status: DC

## 2023-01-20 NOTE — Telephone Encounter (Signed)
New prescription has been sent in. Called patient and advised, patient verbalized understanding.

## 2023-01-28 ENCOUNTER — Other Ambulatory Visit: Payer: Self-pay | Admitting: Family Medicine

## 2023-01-28 DIAGNOSIS — R21 Rash and other nonspecific skin eruption: Secondary | ICD-10-CM

## 2023-01-28 NOTE — Telephone Encounter (Signed)
Requested Prescriptions  Pending Prescriptions Disp Refills   valACYclovir (VALTREX) 1000 MG tablet [Pharmacy Med Name: VALACYCLOVIR HCL 1 GRAM TABLET] 180 tablet 1    Sig: TAKE 1 TABLET BY MOUTH TWICE A DAY     Antimicrobials:  Antiviral Agents - Anti-Herpetic Passed - 01/28/2023  4:37 AM      Passed - Valid encounter within last 12 months    Recent Outpatient Visits           5 months ago Essential hypertension   [facility], MD   6 months ago Essential hypertension   [facility], MD   9 months ago Essential hypertension   [facility], MD   1 year ago Left hip pain   [facility], MD   1 year ago Acute right-sided low back pain without sciatica   [facility], MD       Future Appointments             In 1 month Dorna Mai, MD [facility]   In 2 months Kozlow, Donnamarie Poag, MD [facility] [place] [facility]

## 2023-02-07 ENCOUNTER — Institutional Professional Consult (permissible substitution): Payer: No Typology Code available for payment source | Admitting: Plastic Surgery

## 2023-02-11 ENCOUNTER — Other Ambulatory Visit: Payer: Self-pay | Admitting: Allergy and Immunology

## 2023-02-14 ENCOUNTER — Encounter: Payer: Self-pay | Admitting: Plastic Surgery

## 2023-02-14 ENCOUNTER — Ambulatory Visit (INDEPENDENT_AMBULATORY_CARE_PROVIDER_SITE_OTHER): Payer: Self-pay | Admitting: Plastic Surgery

## 2023-02-14 VITALS — BP 147/77 | HR 81 | Ht 69.0 in | Wt 243.0 lb

## 2023-02-14 DIAGNOSIS — Q178 Other specified congenital malformations of ear: Secondary | ICD-10-CM

## 2023-02-14 NOTE — Progress Notes (Signed)
Referring Provider Dorna Mai, MD [place],  [place] [address]   CC:  Chief Complaint  Patient presents with   Advice Only      Amy Combs is an 62 y.o. female.  HPI: Amy Combs is a 62 year old female who presents today with a left torn earlobe.  She requests repair.  She states that the earlobe piercing has slowly enlarged over the past few years and pulled completely through in 2020.  Allergies  Allergen Reactions   Other     Other reaction(s): Eye Redness Dust, ragweed, grass   Pollen Extract Other (See Comments)   Lisinopril Swelling    Outpatient Encounter Medications as of 02/14/2023  Medication Sig   albuterol (PROVENTIL) (2.5 MG/3ML) 0.083% nebulizer solution    albuterol (VENTOLIN HFA) 108 (90 Base) MCG/ACT inhaler Inhale 2 puffs into the lungs every 4 (four) hours as needed for wheezing or shortness of breath.   amLODipine (NORVASC) 10 MG tablet Take 1 tablet (10 mg total) by mouth daily. Dx Code: I10   amLODipine (NORVASC) 10 MG tablet Take 1 tablet by mouth daily.   beclomethasone (QVAR REDIHALER) 80 MCG/ACT inhaler During asthma flares take in addition to Symbicort inhaler 3 inhalations 3 times a day   Bepotastine Besilate 1.5 % SOLN Place 1 drop into both eyes 2 (two) times daily.   BREZTRI AEROSPHERE 160-9-4.8 MCG/ACT AERO 2 puffs 1-2 times daily.   chlorthalidone (HYGROTON) 25 MG tablet TAKE 1 TABLET (25 MG TOTAL) BY MOUTH DAILY. DX CODE: I10   cyclobenzaprine (FLEXERIL) 5 MG tablet Take 1 tablet (5 mg total) by mouth 3 (three) times daily as needed for muscle spasms.   doxycycline (VIBRAMYCIN) 100 MG capsule Take 1 capsule (100 mg total) by mouth 2 (two) times daily.   famotidine (PEPCID) 20 MG tablet Take 2 tablets by mouth daily.   famotidine (PEPCID) 40 MG tablet Take 1 tablet (40 mg total) by mouth daily.   latanoprost (XALATAN) 0.005 % ophthalmic solution Place 1 drop into both eyes at bedtime.   levalbuterol  (XOPENEX) 1.25 MG/3ML nebulizer solution Take 1.25 mg by nebulization every 4 (four) hours as needed for wheezing.   levocetirizine (XYZAL) 2.5 MG/5ML solution PHARMACY ADMINISTERED   levocetirizine (XYZAL) 2.5 MG/5ML solution INHALE THREE DOSES THREE TIMES DAILY DURING FLARE-UP. RINSE, GARGLE, AND SPIT AFTER USE.   levocetirizine (XYZAL) 2.5 MG/5ML solution TAKE 2 PUFFS BY MOUTH TWICE A DAY   montelukast (SINGULAIR) 10 MG tablet Take 1 tablet (10 mg total) by mouth at bedtime.   Omega 3-6-9 Fatty Acids (OMEGA 3-6-9 COMPLEX PO) Take 1 capsule by mouth in the morning. Krill Omega 50+ with CoQ10   pantoprazole (PROTONIX) 40 MG tablet Take 1 tablet by mouth daily.   pantoprazole (PROTONIX) 40 MG tablet TAKE 1 TABLET BY MOUTH EVERY DAY   PARoxetine (PAXIL) 10 MG tablet TAKE 1 TABLET BY MOUTH EVERY DAY   SIMBRINZA 1-0.2 % SUSP Place 1 drop into both eyes 2 (two) times daily.   telmisartan (MICARDIS) 80 MG tablet TAKE 1 TABLET BY MOUTH EVERY DAY   valACYclovir (VALTREX) 1000 MG tablet TAKE 1 TABLET BY MOUTH TWICE A DAY   vitamin B-12 (CYANOCOBALAMIN) 1000 MCG tablet Take 1,000 mcg by mouth in the morning.   WIXELA INHUB 250-50 MCG/ACT AEPB Inhale 1 puff into the lungs in the morning and at bedtime.   [DISCONTINUED] SYMBICORT 160-4.5 MCG/ACT inhaler Inhale 2 puffs into the lungs 2 (two) times daily. TAKE 2  PUFFS BY MOUTH TWICE A DAY (Patient not taking: Reported on 02/14/2023)   No facility-administered encounter medications on file as of 02/14/2023.     Past Medical History:  Diagnosis Date   Allergy    Ankle swelling    Asthma    GERD (gastroesophageal reflux disease)    Hypertension    Primary open angle glaucoma (POAG) of right eye, severe stage     Past Surgical History:  Procedure Laterality Date   ABDOMINAL SURGERY  1998   mesh placed for scar tissue   CESAREAN SECTION CLASSICAL  1990   COLONOSCOPY  06/06/2020   TUBAL LIGATION  1990    Family History  Problem Relation Age of  Onset   High blood pressure Mother    Cataracts Mother    Glaucoma Mother    High blood pressure Father    Cataracts Maternal Grandfather    Glaucoma Maternal Grandfather    Breast cancer Paternal Aunt    Colon cancer Neg Hx    Esophageal cancer Neg Hx    Rectal cancer Neg Hx    Stomach cancer Neg Hx    Inflammatory bowel disease Neg Hx    Liver disease Neg Hx    Pancreatic cancer Neg Hx     Social History   Social History Narrative   Not on file     Review of Systems General: Denies fevers, chills, weight loss CV: Denies chest pain, shortness of breath, palpitations Skin: Left torn earlobe.  Physical Exam    02/14/2023   10:50 AM 10/26/2022    3:51 PM 08/31/2022    9:35 AM  Vitals with BMI  Height '5\' 9"'$  5' 7.25"   Weight 243 lbs 242 lbs 239 lbs 6 oz  BMI [id number]   Systolic [id number]  Diastolic 77 84 80  Pulse 81 82 80    General:  No acute distress,  Alert and oriented, Non-Toxic, Normal speech and affect Integument: Left torn and completely reepithelialized earlobe Mammogram: August 2023 BI-RADS 1 Assessment/Plan Left torn earlobe: Discussed the procedure with the patient including the need to excise the epithelialized surfaces the removal of the sutures 5 to 7 days postoperatively and the need to allow several months for healing prior to repiercing the ear.  Will schedule for repair in the office.  Camillia Herter 02/14/2023, 11:18 AM

## 2023-03-01 ENCOUNTER — Encounter: Payer: Self-pay | Admitting: Family Medicine

## 2023-03-01 ENCOUNTER — Ambulatory Visit: Payer: No Typology Code available for payment source | Admitting: Family Medicine

## 2023-03-01 VITALS — BP 124/77 | HR 86 | Temp 98.1°F | Resp 16 | Wt 239.2 lb

## 2023-03-01 DIAGNOSIS — Z13220 Encounter for screening for lipoid disorders: Secondary | ICD-10-CM | POA: Diagnosis not present

## 2023-03-01 DIAGNOSIS — Z130 Encounter for screening for diseases of the blood and blood-forming organs and certain disorders involving the immune mechanism: Secondary | ICD-10-CM | POA: Diagnosis not present

## 2023-03-01 DIAGNOSIS — Z6835 Body mass index (BMI) 35.0-35.9, adult: Secondary | ICD-10-CM | POA: Diagnosis not present

## 2023-03-01 DIAGNOSIS — E6609 Other obesity due to excess calories: Secondary | ICD-10-CM

## 2023-03-01 DIAGNOSIS — Z13228 Encounter for screening for other metabolic disorders: Secondary | ICD-10-CM

## 2023-03-01 DIAGNOSIS — Z1329 Encounter for screening for other suspected endocrine disorder: Secondary | ICD-10-CM | POA: Diagnosis not present

## 2023-03-01 DIAGNOSIS — Z0000 Encounter for general adult medical examination without abnormal findings: Secondary | ICD-10-CM

## 2023-03-01 NOTE — Progress Notes (Unsigned)
Established Patient Office Visit  Subjective    Patient ID: Amy Combs, female    DOB: 1961/02/06  Age: 62 y.o. MRN: [id number]  CC:  Chief Complaint  Patient presents with   Annual Exam    HPI Amy Combs presents for routine annual exam. Patient denies acute complaints or concerns.    Outpatient Encounter Medications as of 03/01/2023  Medication Sig   albuterol (PROVENTIL) (2.5 MG/3ML) 0.083% nebulizer solution    albuterol (VENTOLIN HFA) 108 (90 Base) MCG/ACT inhaler Inhale 2 puffs into the lungs every 4 (four) hours as needed for wheezing or shortness of breath.   amLODipine (NORVASC) 10 MG tablet Take 1 tablet (10 mg total) by mouth daily. Dx Code: I10   amLODipine (NORVASC) 10 MG tablet Take 1 tablet by mouth daily.   beclomethasone (QVAR REDIHALER) 80 MCG/ACT inhaler During asthma flares take in addition to Symbicort inhaler 3 inhalations 3 times a day   Bepotastine Besilate 1.5 % SOLN Place 1 drop into both eyes 2 (two) times daily.   BREZTRI AEROSPHERE 160-9-4.8 MCG/ACT AERO 2 puffs 1-2 times daily.   chlorthalidone (HYGROTON) 25 MG tablet TAKE 1 TABLET (25 MG TOTAL) BY MOUTH DAILY. DX CODE: I10   cyclobenzaprine (FLEXERIL) 5 MG tablet Take 1 tablet (5 mg total) by mouth 3 (three) times daily as needed for muscle spasms.   doxycycline (VIBRAMYCIN) 100 MG capsule Take 1 capsule (100 mg total) by mouth 2 (two) times daily.   famotidine (PEPCID) 20 MG tablet Take 2 tablets by mouth daily.   famotidine (PEPCID) 40 MG tablet TAKE 1 TABLET BY MOUTH EVERY DAY   latanoprost (XALATAN) 0.005 % ophthalmic solution Place 1 drop into both eyes at bedtime.   levalbuterol (XOPENEX) 1.25 MG/3ML nebulizer solution Take 1.25 mg by nebulization every 4 (four) hours as needed for wheezing.   levocetirizine (XYZAL) 2.5 MG/5ML solution PHARMACY ADMINISTERED   levocetirizine (XYZAL) 2.5 MG/5ML solution INHALE THREE DOSES THREE TIMES DAILY DURING FLARE-UP. RINSE, GARGLE, AND SPIT  AFTER USE.   levocetirizine (XYZAL) 2.5 MG/5ML solution TAKE 2 PUFFS BY MOUTH TWICE A DAY   montelukast (SINGULAIR) 10 MG tablet Take 1 tablet (10 mg total) by mouth at bedtime.   Omega 3-6-9 Fatty Acids (OMEGA 3-6-9 COMPLEX PO) Take 1 capsule by mouth in the morning. Krill Omega 50+ with CoQ10   pantoprazole (PROTONIX) 40 MG tablet Take 1 tablet by mouth daily.   pantoprazole (PROTONIX) 40 MG tablet TAKE 1 TABLET BY MOUTH EVERY DAY   PARoxetine (PAXIL) 10 MG tablet TAKE 1 TABLET BY MOUTH EVERY DAY   SIMBRINZA 1-0.2 % SUSP Place 1 drop into both eyes 2 (two) times daily.   telmisartan (MICARDIS) 80 MG tablet TAKE 1 TABLET BY MOUTH EVERY DAY   valACYclovir (VALTREX) 1000 MG tablet TAKE 1 TABLET BY MOUTH TWICE A DAY   vitamin B-12 (CYANOCOBALAMIN) 1000 MCG tablet Take 1,000 mcg by mouth in the morning.   WIXELA INHUB 250-50 MCG/ACT AEPB Inhale 1 puff into the lungs in the morning and at bedtime.   No facility-administered encounter medications on file as of 03/01/2023.    Past Medical History:  Diagnosis Date   Allergy    Ankle swelling    Asthma    GERD (gastroesophageal reflux disease)    Hypertension    Primary open angle glaucoma (POAG) of right eye, severe stage     Past Surgical History:  Procedure Laterality Date   ABDOMINAL SURGERY  1998   mesh placed  for scar tissue   CESAREAN SECTION CLASSICAL  1990   COLONOSCOPY  06/06/2020   TUBAL LIGATION  1990    Family History  Problem Relation Age of Onset   High blood pressure Mother    Cataracts Mother    Glaucoma Mother    High blood pressure Father    Cataracts Maternal Grandfather    Glaucoma Maternal Grandfather    Breast cancer Paternal Aunt    Colon cancer Neg Hx    Esophageal cancer Neg Hx    Rectal cancer Neg Hx    Stomach cancer Neg Hx    Inflammatory bowel disease Neg Hx    Liver disease Neg Hx    Pancreatic cancer Neg Hx     Social History   Socioeconomic History   Marital status: Single    Spouse  name: Not on file   Number of children: Not on file   Years of education: Not on file   Highest education level: Not on file  Occupational History   Not on file  Tobacco Use   Smoking status: Never   Smokeless tobacco: Never  Vaping Use   Vaping Use: Never used  Substance and Sexual Activity   Alcohol use: Yes    Comment: occasional   Drug use: Not Currently    Types: Marijuana   Sexual activity: Not on file  Other Topics Concern   Not on file  Social History Narrative   Not on file   Social Determinants of Health   Financial Resource Strain: Low Risk  (03/01/2023)   Overall Financial Resource Strain (CARDIA)    Difficulty of Paying Living Expenses: Not very hard  Food Insecurity: No Food Insecurity (03/01/2023)   Hunger Vital Sign    Worried About Running Out of Food in the Last Year: Never true    Ran Out of Food in the Last Year: Never true  Transportation Needs: No Transportation Needs (03/01/2023)   PRAPARE - Hydrologist (Medical): No    Lack of Transportation (Non-Medical): No  Physical Activity: Inactive (03/01/2023)   Exercise Vital Sign    Days of Exercise per Week: 3 days    Minutes of Exercise per Session: 0 min  Stress: Not on file  Social Connections: Socially Isolated (03/01/2023)   Social Connection and Isolation Panel [NHANES]    Frequency of Communication with Friends and Family: More than three times a week    Frequency of Social Gatherings with Friends and Family: More than three times a week    Attends Religious Services: Never    Marine scientist or Organizations: No    Attends Archivist Meetings: Never    Marital Status: Never married  Intimate Partner Violence: Not At Risk (03/01/2023)   Humiliation, Afraid, Rape, and Kick questionnaire    Fear of Current or Ex-Partner: No    Emotionally Abused: No    Physically Abused: No    Sexually Abused: No    Review of Systems  All other systems reviewed and  are negative.       Objective    BP 124/77   Pulse 86   Temp 98.1 F (36.7 C) (Oral)   Resp 16   Wt 239 lb 3.2 oz (108.5 kg)   LMP 02/27/2016   SpO2 93%   BMI 35.32 kg/m   Physical Exam Vitals and nursing note reviewed.  Constitutional:      General: She is not in acute distress.  Appearance: She is obese.  HENT:     Head: Normocephalic and atraumatic.     Right Ear: Tympanic membrane, ear canal and external ear normal.     Left Ear: Tympanic membrane, ear canal and external ear normal.     Nose: Nose normal.     Mouth/Throat:     Mouth: Mucous membranes are moist.     Pharynx: Oropharynx is clear.  Eyes:     Conjunctiva/sclera: Conjunctivae normal.     Pupils: Pupils are equal, round, and reactive to light.  Neck:     Thyroid: No thyromegaly.  Cardiovascular:     Rate and Rhythm: Normal rate and regular rhythm.     Heart sounds: Normal heart sounds. No murmur heard. Pulmonary:     Effort: Pulmonary effort is normal. No respiratory distress.     Breath sounds: Normal breath sounds.  Abdominal:     General: There is no distension.     Palpations: Abdomen is soft. There is no mass.     Tenderness: There is no abdominal tenderness.  Musculoskeletal:        General: Normal range of motion.     Cervical back: Normal range of motion and neck supple.  Skin:    General: Skin is warm and dry.  Neurological:     General: No focal deficit present.     Mental Status: She is alert and oriented to person, place, and time.  Psychiatric:        Mood and Affect: Mood normal.        Behavior: Behavior normal.     {Labs (Optional):23779}    Assessment & Plan:   1. Annual physical exam *** - CMP14+EGFR  2. Screening for endocrine/metabolic/immunity disorders *** - Vitamin D, 25-hydroxy  3. Screening for lipid disorders *** - Lipid Panel  4. Screening for deficiency anemia *** - CBC with Differential    No follow-ups on file.   Becky Sax,  MD

## 2023-03-02 LAB — LIPID PANEL
Chol/HDL Ratio: 3 ratio (ref 0.0–4.4)
Cholesterol, Total: 200 mg/dL — ABNORMAL HIGH (ref 100–199)
HDL: 67 mg/dL (ref >39)
LDL Chol Calc (NIH): 118 mg/dL — ABNORMAL HIGH (ref 0–99)
Triglycerides: 82 mg/dL (ref 0–149)
VLDL Cholesterol Cal: 15 mg/dL (ref 5–40)

## 2023-03-02 LAB — CBC WITH DIFFERENTIAL/PLATELET
Basophils Absolute: 0.1 10*3/uL (ref 0.0–0.2)
Basos: 1 %
EOS (ABSOLUTE): 0.2 10*3/uL (ref 0.0–0.4)
Eos: 4 %
Hematocrit: 40.7 % (ref 34.0–46.6)
Hemoglobin: 13.7 g/dL (ref 11.1–15.9)
Immature Grans (Abs): 0 10*3/uL (ref 0.0–0.1)
Immature Granulocytes: 0 %
Lymphocytes Absolute: 2.4 10*3/uL (ref 0.7–3.1)
Lymphs: 44 %
MCH: 32.2 pg (ref 26.6–33.0)
MCHC: 33.7 g/dL (ref 31.5–35.7)
MCV: 96 fL (ref 79–97)
Monocytes Absolute: 0.5 10*3/uL (ref 0.1–0.9)
Monocytes: 9 %
Neutrophils Absolute: 2.3 10*3/uL (ref 1.4–7.0)
Neutrophils: 42 %
Platelets: 335 10*3/uL (ref 150–450)
RBC: 4.25 x10E6/uL (ref 3.77–5.28)
RDW: 11.7 % (ref 11.7–15.4)
WBC: 5.5 10*3/uL (ref 3.4–10.8)

## 2023-03-02 LAB — CMP14+EGFR
ALT: 22 IU/L (ref 0–32)
AST: 23 IU/L (ref 0–40)
Albumin/Globulin Ratio: 1.4 (ref 1.2–2.2)
Albumin: 4.5 g/dL (ref 3.8–4.9)
Alkaline Phosphatase: 102 IU/L (ref 44–121)
BUN/Creatinine Ratio: 20 (ref 12–28)
BUN: 17 mg/dL (ref 8–27)
Bilirubin Total: 0.4 mg/dL (ref 0.0–1.2)
CO2: 26 mmol/L (ref 20–29)
Calcium: 9.8 mg/dL (ref 8.7–10.3)
Chloride: 99 mmol/L (ref 96–106)
Creatinine, Ser: 0.87 mg/dL (ref 0.57–1.00)
Globulin, Total: 3.3 g/dL (ref 1.5–4.5)
Glucose: 101 mg/dL — ABNORMAL HIGH (ref 70–99)
Potassium: 3.9 mmol/L (ref 3.5–5.2)
Sodium: 142 mmol/L (ref 134–144)
Total Protein: 7.8 g/dL (ref 6.0–8.5)
eGFR: 76 mL/min/{1.73_m2} (ref >59)

## 2023-03-02 LAB — VITAMIN D 25 HYDROXY (VIT D DEFICIENCY, FRACTURES): Vit D, 25-Hydroxy: 25.1 ng/mL — ABNORMAL LOW (ref 30.0–100.0)

## 2023-03-03 ENCOUNTER — Encounter: Payer: Self-pay | Admitting: Family Medicine

## 2023-03-17 ENCOUNTER — Ambulatory Visit (INDEPENDENT_AMBULATORY_CARE_PROVIDER_SITE_OTHER): Payer: Self-pay | Admitting: Plastic Surgery

## 2023-03-17 ENCOUNTER — Encounter: Payer: Self-pay | Admitting: Plastic Surgery

## 2023-03-17 VITALS — BP 127/76 | HR 77 | Ht 69.0 in | Wt 239.0 lb

## 2023-03-17 DIAGNOSIS — Q178 Other specified congenital malformations of ear: Secondary | ICD-10-CM

## 2023-03-17 NOTE — Progress Notes (Signed)
Procedure Note  Preoperative Dx: Split left earlobe  Postoperative Dx: Same  Procedure: Repair of acquired defect, left earlobe  Anesthesia: Lidocaine 1% with 1:100,000 epinephrine and 0.25% Sensorcaine   Indication for Procedure: Repair of earlobe at patient's request  Description of Procedure: Risks and complications were explained to the patient including the possibility that the earlobe may tear again if she puts an ear ring back in the same hole too soon or if the earring is too heavy.  Consent was confirmed and the patient understands the risks and benefits.  The potential complications and alternatives were explained and the patient consents.  The patient expressed understanding the option of not having the procedure and the risks of a scar.  Time out was called and all information was confirmed to be correct.    The area was prepped and drapped.  Local anesthetic was injected in the subcutaneous tissues.  After waiting for the local to take affect the epithelialized edges of the defect were excised.  After obtaining hemostasis, the surgical wound was closed with a 5-0 Monocryl suture in the subcutaneous tissues and interrupted 5-0 Prolene sutures in the skin.  The surgical wound measured 1 cm.  A dressing was applied.  The patient was given instructions on how to care for the area and a follow up appointment.  Amy Combs tolerated the procedure well and there were no complications.

## 2023-03-22 NOTE — Progress Notes (Signed)
Patient is a pleasant 62 year old female with PMH of split left earlobe s/p repair performed in office 03/17/2023 by Dr. Lovena Le who presents to clinic for postprocedural follow-up.  Reviewed the procedure note and the earlobe was repaired with 5-0 Prolene sutures.  She understands that she will need to allow for several months healing prior to repiercing the ear.  Today, patient is doing well.  She feels repaired for suture removal.  No specific questions or complaints.  On exam, skin edges appear well-approximated.  No overlying skin changes or evidence concerning for infection.  A total of 4 single interrupted Prolene sutures were removed.  A small amount of incisional bleeding was noted on the posterior aspect of earlobe after conclusion of removal.  Applied gauze and pressure.  Recommending Vaseline daily as well as gauze as needed for any light bleeding.  She understands that she is not to repierce the ear for at least a few months.  She plans to abstain until the end of the year.

## 2023-03-24 ENCOUNTER — Ambulatory Visit (INDEPENDENT_AMBULATORY_CARE_PROVIDER_SITE_OTHER): Payer: Self-pay | Admitting: Physician Assistant

## 2023-03-24 DIAGNOSIS — Q178 Other specified congenital malformations of ear: Secondary | ICD-10-CM

## 2023-04-09 ENCOUNTER — Other Ambulatory Visit: Payer: Self-pay | Admitting: Family Medicine

## 2023-04-09 DIAGNOSIS — I10 Essential (primary) hypertension: Secondary | ICD-10-CM

## 2023-04-11 ENCOUNTER — Other Ambulatory Visit: Payer: Self-pay | Admitting: Family Medicine

## 2023-04-11 NOTE — Telephone Encounter (Signed)
Requested Prescriptions  Pending Prescriptions Disp Refills   telmisartan (MICARDIS) 80 MG tablet [Pharmacy Med Name: TELMISARTAN 80 MG TABLET] 90 tablet 1    Sig: TAKE 1 TABLET BY MOUTH EVERY DAY     Cardiovascular:  Angiotensin Receptor Blockers Passed - 04/09/2023  9:08 AM      Passed - Cr in normal range and within 180 days    Creatinine, Ser  Date Value Ref Range Status  03/01/2023 0.87 0.57 - 1.00 mg/dL Final         Passed - K in normal range and within 180 days    Potassium  Date Value Ref Range Status  03/01/2023 3.9 3.5 - 5.2 mmol/L Final         Passed - Patient is not pregnant      Passed - Last BP in normal range    BP Readings from Last 1 Encounters:  03/17/23 127/76         Passed - Valid encounter within last 6 months    Recent Outpatient Visits           1 month ago Annual physical exam   [place] [facility], [place]   7 months ago Essential hypertension   [place] [facility], MD   8 months ago Essential hypertension   [place] [facility], MD   11 months ago Essential hypertension   [place] [facility], MD   1 year ago Left hip pain   [place] [facility], MD       Future Appointments             In 2 weeks Kozlow, Alvira Philips, MD [facility] [place]   In 4 months Georganna Skeans, MD [facility]

## 2023-04-23 ENCOUNTER — Other Ambulatory Visit: Payer: Self-pay | Admitting: Family Medicine

## 2023-04-25 NOTE — Telephone Encounter (Signed)
Requested Prescriptions  Pending Prescriptions Disp Refills   amLODipine (NORVASC) 10 MG tablet [Pharmacy Med Name: AMLODIPINE BESYLATE 10 MG TAB] 90 tablet 0    Sig: TAKE 1 TABLET (10 MG TOTAL) BY MOUTH DAILY     Cardiovascular: Calcium Channel Blockers 2 Passed - 04/23/2023 11:25 AM      Passed - Last BP in normal range    BP Readings from Last 1 Encounters:  03/17/23 127/76         Passed - Last Heart Rate in normal range    Pulse Readings from Last 1 Encounters:  03/17/23 77         Passed - Valid encounter within last 6 months    Recent Outpatient Visits           1 month ago Annual physical exam   [place] [facility], MD   7 months ago Essential hypertension   [place] [facility], MD   9 months ago Essential hypertension   [place] [facility], MD   12 months ago Essential hypertension   [place] [facility] [place], MD   1 year ago Left hip pain   [place] [facility], MD       Future Appointments             Tomorrow Kozlow, Alvira Philips, MD [facility] [place] [facility] [place]   In 4 months Georganna Skeans, MD [facility]

## 2023-04-26 ENCOUNTER — Ambulatory Visit (INDEPENDENT_AMBULATORY_CARE_PROVIDER_SITE_OTHER): Payer: No Typology Code available for payment source | Admitting: Allergy and Immunology

## 2023-04-26 ENCOUNTER — Encounter: Payer: Self-pay | Admitting: Allergy and Immunology

## 2023-04-26 ENCOUNTER — Other Ambulatory Visit: Payer: Self-pay

## 2023-04-26 VITALS — BP 128/74 | HR 86 | Temp 97.7°F | Resp 18 | Ht 69.0 in | Wt 247.0 lb

## 2023-04-26 DIAGNOSIS — H1013 Acute atopic conjunctivitis, bilateral: Secondary | ICD-10-CM | POA: Diagnosis not present

## 2023-04-26 DIAGNOSIS — K219 Gastro-esophageal reflux disease without esophagitis: Secondary | ICD-10-CM

## 2023-04-26 DIAGNOSIS — J3089 Other allergic rhinitis: Secondary | ICD-10-CM | POA: Diagnosis not present

## 2023-04-26 DIAGNOSIS — J302 Other seasonal allergic rhinitis: Secondary | ICD-10-CM | POA: Diagnosis not present

## 2023-04-26 DIAGNOSIS — J4540 Moderate persistent asthma, uncomplicated: Secondary | ICD-10-CM

## 2023-04-26 DIAGNOSIS — H1010 Acute atopic conjunctivitis, unspecified eye: Secondary | ICD-10-CM

## 2023-04-26 MED ORDER — BREZTRI AEROSPHERE 160-9-4.8 MCG/ACT IN AERO: INHALATION_SPRAY | RESPIRATORY_TRACT | 1 refills | Status: DC

## 2023-04-26 MED ORDER — AIRSUPRA 90-80 MCG/ACT IN AERO: 2.0000 | INHALATION_SPRAY | RESPIRATORY_TRACT | 1 refills | Status: DC | PRN

## 2023-04-26 MED ORDER — PANTOPRAZOLE SODIUM 40 MG PO TBEC: 40.0000 mg | DELAYED_RELEASE_TABLET | Freq: Every morning | ORAL | 1 refills | Status: DC

## 2023-04-26 MED ORDER — FAMOTIDINE 40 MG PO TABS: 40.0000 mg | ORAL_TABLET | Freq: Every evening | ORAL | 1 refills | Status: DC

## 2023-04-26 MED ORDER — MONTELUKAST SODIUM 10 MG PO TABS: 10.0000 mg | ORAL_TABLET | Freq: Every evening | ORAL | 1 refills | Status: DC

## 2023-04-26 MED ORDER — CETIRIZINE HCL 10 MG PO TABS: 10.0000 mg | ORAL_TABLET | Freq: Every day | ORAL | 1 refills | Status: AC | PRN

## 2023-04-26 MED ORDER — BEPOTASTINE BESILATE 1.5 % OP SOLN: 1.0000 [drp] | Freq: Two times a day (BID) | OPHTHALMIC | 1 refills | Status: DC

## 2023-04-26 NOTE — Progress Notes (Unsigned)
[place] - [place] - [place] - [place] - [place]   Follow-up Note  Referring Provider: Georganna Skeans, MD Primary Provider: Georganna Skeans, MD Date of Office Visit: 04/26/2023  Subjective:   Amy Combs (DOB: Aug 25, 1961) is a 62 y.o. female who [facility] 04[facility]26/2023 in re-evaluation of the following:  HPI: Amy Combs turns to this clinic in evaluation of asthma, allergic rhinoconjunctivitis, reflux.  I last saw him in this clinic 26 October 2022.  She is done very well.  She did contract COVID around Christmas time but it was mostly a constitutional event with aching and fatigue and very little respiratory tract symptoms that she resolved within a week.  About 2 weeks ago she did develop some coughing and some nasal burning and some itchy eyes and sneezing for which she restarted her Zyrtec and she used some short acting bronchodilator and she is much better at this point in time.  Other than those events she has really done well while using a triple inhaler and she has a minimal requirement for using albuterol and she had very little problems with her nose while using montelukast.  Her reflux has been under excellent control with the pantoprazole and famotidine.  Allergies as of 04/26/2023       Reactions   Other    Other reaction(s): Eye Redness Dust, ragweed, grass   Pollen Extract Other (See Comments)   Lisinopril Swelling        Medication List    albuterol (2.5 MG/3ML) 0.083% nebulizer solution Commonly known as: PROVENTIL   albuterol 108 (90 Base) MCG/ACT inhaler Commonly known as: VENTOLIN HFA Inhale 2 puffs into the lungs every 4 (four) hours as needed for wheezing or shortness of breath.   amLODipine 10 MG tablet Commonly known as: NORVASC Take 1 tablet by mouth daily.   amLODipine 10 MG tablet Commonly known as: NORVASC TAKE 1 TABLET (10 MG TOTAL) BY MOUTH DAILY   Bepotastine Besilate 1.5 % Soln Place 1 drop  into both eyes 2 (two) times daily.   Breztri Aerosphere 160-9-4.8 MCG/ACT Aero Generic drug: Budeson-Glycopyrrol-Formoterol 2 puffs 1-2 times daily.   chlorthalidone 25 MG tablet Commonly known as: HYGROTON TAKE 1 TABLET (25 MG TOTAL) BY MOUTH DAILY. DX CODE: I10   cyanocobalamin 1000 MCG tablet Commonly known as: VITAMIN B12 Take 1,000 mcg by mouth in the morning.   cyclobenzaprine 5 MG tablet Commonly known as: FLEXERIL Take 1 tablet (5 mg total) by mouth 3 (three) times daily as needed for muscle spasms.   doxycycline 100 MG capsule Commonly known as: VIBRAMYCIN Take 1 capsule (100 mg total) by mouth 2 (two) times daily.   famotidine 20 MG tablet Commonly known as: PEPCID Take 2 tablets by mouth daily.   famotidine 40 MG tablet Commonly known as: PEPCID TAKE 1 TABLET BY MOUTH EVERY DAY   latanoprost 0.005 % ophthalmic solution Commonly known as: XALATAN Place 1 drop into both eyes at bedtime.   levalbuterol 1.25 MG/3ML nebulizer solution Commonly known as: XOPENEX Take 1.25 mg by nebulization every 4 (four) hours as needed for wheezing.   montelukast 10 MG tablet Commonly known as: SINGULAIR Take 1 tablet (10 mg total) by mouth at bedtime.   OMEGA 3-6-9 COMPLEX PO Take 1 capsule by mouth in the morning. Krill Omega 50+ with CoQ10   pantoprazole 40 MG tablet Commonly known as: PROTONIX Take 1 tablet by mouth daily.   pantoprazole 40 MG tablet Commonly known as:  PROTONIX TAKE 1 TABLET BY MOUTH EVERY DAY   PARoxetine 10 MG tablet Commonly known as: PAXIL TAKE 1 TABLET BY MOUTH EVERY DAY   Qvar RediHaler 80 MCG/ACT inhaler Generic drug: beclomethasone During asthma flares take in addition to Symbicort inhaler 3 inhalations 3 times a day   Simbrinza 1-0.2 % Susp Generic drug: Brinzolamide-Brimonidine Place 1 drop into both eyes 2 (two) times daily.   telmisartan 80 MG tablet Commonly known as: MICARDIS TAKE 1 TABLET BY MOUTH EVERY DAY   valACYclovir  1000 MG tablet Commonly known as: VALTREX TAKE 1 TABLET BY MOUTH TWICE A DAY    Past Medical History:  Diagnosis Date   Allergy    Ankle swelling    Asthma    GERD (gastroesophageal reflux disease)    Hypertension    Primary open angle glaucoma (POAG) of right eye, severe stage     Past Surgical History:  Procedure Laterality Date   ABDOMINAL SURGERY  1998   mesh placed for scar tissue   CESAREAN SECTION CLASSICAL  1990   COLONOSCOPY  06/06/2020   TUBAL LIGATION  1990    Review of systems negative except as noted in HPI / PMHx or noted below:  Review of Systems  Constitutional: Negative.   HENT: Negative.    Eyes: Negative.   Respiratory: Negative.    Cardiovascular: Negative.   Gastrointestinal: Negative.   Genitourinary: Negative.   Musculoskeletal: Negative.   Skin: Negative.   Neurological: Negative.   Endo/Heme/Allergies: Negative.   Psychiatric/Behavioral: Negative.       Objective:   Vitals:   04/26/23 1555  BP: 128/74  Pulse: 86  Resp: 18  Temp: 97.7 F (36.5 C)  SpO2: 95%   Height: 5\' 9"  (175.3 cm)  Weight: 247 lb (112 kg)   Physical Exam Constitutional:      Appearance: She is not diaphoretic.  HENT:     Head: Normocephalic.     Right Ear: Tympanic membrane, ear canal and external ear normal.     Left Ear: Tympanic membrane, ear canal and external ear normal.     Nose: Nose normal. No mucosal edema or rhinorrhea.     Mouth/Throat:     Pharynx: Uvula midline. No oropharyngeal exudate.  Eyes:     Conjunctiva/sclera: Conjunctivae normal.  Neck:     Thyroid: No thyromegaly.     Trachea: Trachea normal. No tracheal tenderness or tracheal deviation.  Cardiovascular:     Rate and Rhythm: Normal rate and regular rhythm.     Heart sounds: Normal heart sounds, S1 normal and S2 normal. No murmur heard. Pulmonary:     Effort: No respiratory distress.     Breath sounds: Normal breath sounds. No stridor. No wheezing or rales.  Lymphadenopathy:      Head:     Right side of head: No tonsillar adenopathy.     Left side of head: No tonsillar adenopathy.     Cervical: No cervical adenopathy.  Skin:    Findings: No erythema or rash.     Nails: There is no clubbing.  Neurological:     Mental Status: She is alert.     Diagnostics:    Spirometry was performed and demonstrated an FEV1 of 1.96 at 79 % of predicted.  Assessment and Plan:   1. Asthma, moderate persistent, well-controlled   2. Seasonal and perennial allergic rhinitis   3. Seasonal allergic conjunctivitis   4. LPRD (laryngopharyngeal reflux disease)    1. Continue Breztri -  2 inhalations twice a day   2. Continue Montelukast 10 mg daily  3. Continue Pantoprazole 40 mg in the morning + famotidine 40  mg in evening  4. If Needed:  A. AIRSUPRA- 2 inhalations or albuterol nebulization every 4-6 hours  B. OTC antihistamine  C. Bepreve eye drops I  5. Return to clinic in 6 months or earlier if problem  Kathie appears to be doing pretty well while utilizing medical therapy directed against respiratory tract inflammation and reflux induced respiratory disease as noted above.  I have given her a anti-inflammatory rescue medication to be utilized instead of her albuterol.  I will see her back in this clinic in 6 months or earlier if there is a problem.  Laurette Schimke, MD Allergy / Immunology [facility]

## 2023-04-26 NOTE — Patient Instructions (Addendum)
  1. Continue Breztri - 2 inhalations twice a day   2. Continue Montelukast 10 mg daily  3. Continue Pantoprazole 40 mg in the morning + famotidine 40  mg in evening  4. If Needed:  A. AIRSUPRA- 2 inhalations or albuterol nebulization every 4-6 hours  B. OTC antihistamine  C. Bepreve eye drops I  5. Return to clinic in 6 months or earlier if problem

## 2023-04-27 ENCOUNTER — Encounter: Payer: Self-pay | Admitting: Allergy and Immunology

## 2023-05-04 NOTE — Progress Notes (Unsigned)
Amy Combs [facility] [address] [place] [address] Phone: [phone]   Assessment and Plan:     There are no diagnoses linked to this encounter.  ***   Pertinent previous records reviewed include ***   Follow Up: ***     Subjective:   I, Amy Combs, am serving as a Neurosurgeon for Doctor Richardean Sale  Chief Complaint: low back pain   HPI:   05/05/2023 Patient is a 62 year old female complaining of low back pain. Patient states  Relevant Historical Information: ***  Additional pertinent review of systems negative.   Current Outpatient Medications:    albuterol (PROVENTIL) (2.5 MG/3ML) 0.083% nebulizer solution, , Disp: , Rfl:    albuterol (VENTOLIN HFA) 108 (90 Base) MCG/ACT inhaler, Inhale 2 puffs into the lungs every 4 (four) hours as needed for wheezing or shortness of breath., Disp: 18 g, Rfl: 1   Albuterol-Budesonide (AIRSUPRA) 90-80 MCG/ACT AERO, Inhale 2 puffs into the lungs every 4 (four) hours as needed., Disp: 10.7 g, Rfl: 1   amLODipine (NORVASC) 10 MG tablet, Take 1 tablet by mouth daily., Disp: , Rfl:    amLODipine (NORVASC) 10 MG tablet, TAKE 1 TABLET (10 MG TOTAL) BY MOUTH DAILY, Disp: 90 tablet, Rfl: 0   beclomethasone (QVAR REDIHALER) 80 MCG/ACT inhaler, During asthma flares take in addition to Symbicort inhaler 3 inhalations 3 times a day, Disp: 1 each, Rfl: 3   Bepotastine Besilate 1.5 % SOLN, Place 1 drop into both eyes 2 (two) times daily., Disp: 30 mL, Rfl: 1   BREZTRI AEROSPHERE 160-9-4.8 MCG/ACT AERO, 2 puffs 1-2 times daily., Disp: 3 each, Rfl: 1   cetirizine (ZYRTEC) 10 MG tablet, Take 1 tablet (10 mg total) by mouth daily as needed for allergies (Can take an extra dose during flare ups.)., Disp: 180 tablet, Rfl: 1   chlorthalidone (HYGROTON) 25 MG tablet, TAKE 1 TABLET (25 MG TOTAL) BY MOUTH DAILY. DX CODE: I10, Disp: 90 tablet, Rfl: 0   cyclobenzaprine (FLEXERIL) 5 MG tablet, Take 1 tablet (5  mg total) by mouth 3 (three) times daily as needed for muscle spasms., Disp: 30 tablet, Rfl: 1   doxycycline (VIBRAMYCIN) 100 MG capsule, Take 1 capsule (100 mg total) by mouth 2 (two) times daily., Disp: 20 capsule, Rfl: 0   famotidine (PEPCID) 40 MG tablet, Take 1 tablet (40 mg total) by mouth at bedtime., Disp: 90 tablet, Rfl: 1   latanoprost (XALATAN) 0.005 % ophthalmic solution, Place 1 drop into both eyes at bedtime., Disp: , Rfl:    levalbuterol (XOPENEX) 1.25 MG/3ML nebulizer solution, Take 1.25 mg by nebulization every 4 (four) hours as needed for wheezing., Disp: 90 mL, Rfl: 1   montelukast (SINGULAIR) 10 MG tablet, Take 1 tablet (10 mg total) by mouth at bedtime., Disp: 90 tablet, Rfl: 1   Omega 3-6-9 Fatty Acids (OMEGA 3-6-9 COMPLEX PO), Take 1 capsule by mouth in the morning. Krill Omega 50+ with CoQ10, Disp: , Rfl:    pantoprazole (PROTONIX) 40 MG tablet, Take 1 tablet (40 mg total) by mouth in the morning., Disp: 90 tablet, Rfl: 1   PARoxetine (PAXIL) 10 MG tablet, TAKE 1 TABLET BY MOUTH EVERY DAY, Disp: 90 tablet, Rfl: 1   SIMBRINZA 1-0.2 % SUSP, Place 1 drop into both eyes 2 (two) times daily., Disp: , Rfl:    telmisartan (MICARDIS) 80 MG tablet, TAKE 1 TABLET BY MOUTH EVERY DAY, Disp: 90 tablet, Rfl: 1  valACYclovir (VALTREX) 1000 MG tablet, TAKE 1 TABLET BY MOUTH TWICE A DAY, Disp: 180 tablet, Rfl: 1   vitamin B-12 (CYANOCOBALAMIN) 1000 MCG tablet, Take 1,000 mcg by mouth in the morning., Disp: , Rfl:    Objective:     There were no vitals filed for this visit.    There is no height or weight on file to calculate BMI.    Physical Exam:    ***   Electronically signed by:  Amy Combs [facility] 7:29 AM 05/04/23

## 2023-05-05 ENCOUNTER — Ambulatory Visit: Payer: No Typology Code available for payment source | Admitting: Sports Medicine

## 2023-05-05 ENCOUNTER — Ambulatory Visit: Payer: No Typology Code available for payment source

## 2023-05-05 VITALS — BP 124/80 | HR 80 | Ht 69.0 in | Wt 248.0 lb

## 2023-05-05 DIAGNOSIS — M5441 Lumbago with sciatica, right side: Secondary | ICD-10-CM | POA: Diagnosis not present

## 2023-05-05 DIAGNOSIS — G8929 Other chronic pain: Secondary | ICD-10-CM

## 2023-05-05 MED ORDER — MELOXICAM 15 MG PO TABS
15.0000 mg | ORAL_TABLET | Freq: Every day | ORAL | 0 refills | Status: DC
Start: 2023-05-05 — End: 2024-07-12

## 2023-05-05 NOTE — Patient Instructions (Addendum)
Good to see you  - Start meloxicam 15 mg daily x2 weeks.  If still having pain after 2 weeks, complete 3rd-week of meloxicam. May use remaining meloxicam as needed once daily for pain control.  Do not to use additional NSAIDs while taking meloxicam.  May use Tylenol [phone] mg 2 to 3 times a day for breakthrough pain. Lower back HEP See me in 3-4 weeks

## 2023-05-12 ENCOUNTER — Other Ambulatory Visit: Payer: Self-pay | Admitting: Family Medicine

## 2023-05-12 DIAGNOSIS — R21 Rash and other nonspecific skin eruption: Secondary | ICD-10-CM

## 2023-05-12 NOTE — Telephone Encounter (Signed)
Rx 01/28/23 #180 1RF- too soon Requested Prescriptions  Pending Prescriptions Disp Refills   valACYclovir (VALTREX) 1000 MG tablet [Pharmacy Med Name: VALACYCLOVIR HCL 1 GRAM TABLET] 180 tablet 1    Sig: TAKE 1 TABLET BY MOUTH TWICE A DAY     Antimicrobials:  Antiviral Agents - Anti-Herpetic Passed - 05/12/2023  2:30 AM      Passed - Valid encounter within last 12 months    Recent Outpatient Visits           2 months ago Annual physical exam   [place] [facility], MD   8 months ago Essential hypertension   [place] [facility], MD   9 months ago Essential hypertension   [place] [facility], MD   1 year ago Essential hypertension   [place] [facility], MD   1 year ago Left hip pain   [place] [facility], MD       Future Appointments             In 3 weeks Richardean Sale, DO [facility] [place] [facility]   In 3 months Georganna Skeans, MD [facility]

## 2023-06-01 ENCOUNTER — Other Ambulatory Visit: Payer: Self-pay | Admitting: Sports Medicine

## 2023-06-01 NOTE — Progress Notes (Signed)
Amy Combs [facility] [address] [place] [address] Phone: [phone]   Assessment and Plan:     1. Chronic bilateral low back pain with right-sided sciatica  -Chronic with exacerbation, subsequent visit - Overall improvement in back pain and improvement in radicular symptoms with patient completing course of meloxicam, starting HEP, and purchasing a leg brace - Patient still having mild intermittent symptoms, so we will continue with conservative therapy at this time - Start Tylenol 500 to 1000 mg tablets 2-3 times a day for day-to-day pain relief -Discontinue meloxicam and may use remaining as needed for severe breakthrough pain.  Recommend limiting to no more frequent than 1-2 times per week for long-term use - Continue HEP.  Offered physical therapy which patient declines at this time but would consider a future office visit if no improvement  Pertinent previous records reviewed include none   Follow Up: 6 weeks for reevaluation.  If no improvement or worsening of symptoms, could consider physical therapy versus gabapentin versus MRI   Subjective:   I, Amy Combs, am serving as a Neurosurgeon for Doctor Richardean Sale   Chief Complaint: low back pain    HPI:    05/05/2023 Patient is a 62 year old female complaining of low back pain. Patient states that she fell a year ago, she was told that she had sciatica, pain is worse on the right , pain radiates down the leg and the leg feels numb sometimes, motrin for the pain and that helped a little, Aspercreme helps as well, she uses a be active knee brace and that really helps with her leg pain,   06/02/2023 Patient states she is okay has some pain but it is intermittent     Relevant Historical Information: Hypertension,  Additional pertinent review of systems negative.   Current Outpatient Medications:    albuterol (PROVENTIL) (2.5 MG/3ML) 0.083% nebulizer solution, , Disp: ,  Rfl:    albuterol (VENTOLIN HFA) 108 (90 Base) MCG/ACT inhaler, Inhale 2 puffs into the lungs every 4 (four) hours as needed for wheezing or shortness of breath., Disp: 18 g, Rfl: 1   Albuterol-Budesonide (AIRSUPRA) 90-80 MCG/ACT AERO, Inhale 2 puffs into the lungs every 4 (four) hours as needed., Disp: 10.7 g, Rfl: 1   amLODipine (NORVASC) 10 MG tablet, Take 1 tablet by mouth daily., Disp: , Rfl:    amLODipine (NORVASC) 10 MG tablet, TAKE 1 TABLET (10 MG TOTAL) BY MOUTH DAILY, Disp: 90 tablet, Rfl: 0   beclomethasone (QVAR REDIHALER) 80 MCG/ACT inhaler, During asthma flares take in addition to Symbicort inhaler 3 inhalations 3 times a day, Disp: 1 each, Rfl: 3   Bepotastine Besilate 1.5 % SOLN, Place 1 drop into both eyes 2 (two) times daily., Disp: 30 mL, Rfl: 1   BREZTRI AEROSPHERE 160-9-4.8 MCG/ACT AERO, 2 puffs 1-2 times daily., Disp: 3 each, Rfl: 1   cetirizine (ZYRTEC) 10 MG tablet, Take 1 tablet (10 mg total) by mouth daily as needed for allergies (Can take an extra dose during flare ups.)., Disp: 180 tablet, Rfl: 1   chlorthalidone (HYGROTON) 25 MG tablet, TAKE 1 TABLET (25 MG TOTAL) BY MOUTH DAILY. DX CODE: I10, Disp: 90 tablet, Rfl: 0   cyclobenzaprine (FLEXERIL) 5 MG tablet, Take 1 tablet (5 mg total) by mouth 3 (three) times daily as needed for muscle spasms., Disp: 30 tablet, Rfl: 1   doxycycline (VIBRAMYCIN) 100 MG capsule, Take 1 capsule (100 mg total) by mouth 2 (  two) times daily., Disp: 20 capsule, Rfl: 0   famotidine (PEPCID) 40 MG tablet, Take 1 tablet (40 mg total) by mouth at bedtime., Disp: 90 tablet, Rfl: 1   latanoprost (XALATAN) 0.005 % ophthalmic solution, Place 1 drop into both eyes at bedtime., Disp: , Rfl:    levalbuterol (XOPENEX) 1.25 MG/3ML nebulizer solution, Take 1.25 mg by nebulization every 4 (four) hours as needed for wheezing., Disp: 90 mL, Rfl: 1   meloxicam (MOBIC) 15 MG tablet, Take 1 tablet (15 mg total) by mouth daily., Disp: 30 tablet, Rfl: 0   montelukast  (SINGULAIR) 10 MG tablet, Take 1 tablet (10 mg total) by mouth at bedtime., Disp: 90 tablet, Rfl: 1   Omega 3-6-9 Fatty Acids (OMEGA 3-6-9 COMPLEX PO), Take 1 capsule by mouth in the morning. Krill Omega 50+ with CoQ10, Disp: , Rfl:    pantoprazole (PROTONIX) 40 MG tablet, Take 1 tablet (40 mg total) by mouth in the morning., Disp: 90 tablet, Rfl: 1   PARoxetine (PAXIL) 10 MG tablet, TAKE 1 TABLET BY MOUTH EVERY DAY, Disp: 90 tablet, Rfl: 1   SIMBRINZA 1-0.2 % SUSP, Place 1 drop into both eyes 2 (two) times daily., Disp: , Rfl:    telmisartan (MICARDIS) 80 MG tablet, TAKE 1 TABLET BY MOUTH EVERY DAY, Disp: 90 tablet, Rfl: 1   valACYclovir (VALTREX) 1000 MG tablet, TAKE 1 TABLET BY MOUTH TWICE A DAY, Disp: 180 tablet, Rfl: 1   vitamin B-12 (CYANOCOBALAMIN) 1000 MCG tablet, Take 1,000 mcg by mouth in the morning., Disp: , Rfl:    Objective:     Vitals:   06/02/23 1255  BP: 132/80  Pulse: 87  SpO2: 98%  Weight: 243 lb (110.2 kg)  Height: 5\' 9"  (1.753 m)      Body mass index is 35.88 kg/m.    Physical Exam:    Gen: Appears well, nad, nontoxic and pleasant Psych: Alert and oriented, appropriate mood and affect Neuro: sensation intact, strength is 5/5 in upper and lower extremities, muscle tone wnl Skin: no susupicious lesions or rashes   Back - Normal skin, Spine with normal alignment and no deformity.   No tenderness to vertebral process palpation.   Right lumbar paraspinous muscles are   tender and without spasm NTTP gluteal musculature Straight leg raise negative Trendelenberg positive right Piriformis Test negative for radicular symptoms, though reproduced gluteal pain and stretching Gait normal   Electronically signed by:  Amy Combs [facility] 1:09 PM 06/02/23

## 2023-06-02 ENCOUNTER — Ambulatory Visit: Payer: No Typology Code available for payment source | Admitting: Sports Medicine

## 2023-06-02 VITALS — BP 132/80 | HR 87 | Ht 69.0 in | Wt 243.0 lb

## 2023-06-02 DIAGNOSIS — G8929 Other chronic pain: Secondary | ICD-10-CM | POA: Diagnosis not present

## 2023-06-02 DIAGNOSIS — M5441 Lumbago with sciatica, right side: Secondary | ICD-10-CM | POA: Diagnosis not present

## 2023-06-02 NOTE — Patient Instructions (Signed)
Tylenol [id number] mg 2-3 times a day for pain relief  Discontinue meloxicam and use remainder as needed no ore than 1-2 times per week Continue HEP 6 week follow up

## 2023-07-13 ENCOUNTER — Other Ambulatory Visit: Payer: Self-pay | Admitting: Family Medicine

## 2023-07-13 DIAGNOSIS — Z1231 Encounter for screening mammogram for malignant neoplasm of breast: Secondary | ICD-10-CM

## 2023-07-13 NOTE — Progress Notes (Unsigned)
Aleen Sells [facility] [address] [place] [address] Phone: [phone]   Assessment and Plan:     There are no diagnoses linked to this encounter.  ***   Pertinent previous records reviewed include ***   Follow Up: ***     Subjective:   I, Amy Combs, am serving as a Neurosurgeon for Doctor Richardean Sale   Chief Complaint: low back pain    HPI:    05/05/2023 Patient is a 62 year old female complaining of low back pain. Patient states that she fell a year ago, she was told that she had sciatica, pain is worse on the right , pain radiates down the leg and the leg feels numb sometimes, motrin for the pain and that helped a little, Aspercreme helps as well, she uses a be active knee brace and that really helps with her leg pain,    06/02/2023 Patient states she is okay has some pain but it is intermittent    07/14/2023 Patient states    Relevant Historical Information: Hypertension,  Additional pertinent review of systems negative.   Current Outpatient Medications:    albuterol (PROVENTIL) (2.5 MG/3ML) 0.083% nebulizer solution, , Disp: , Rfl:    albuterol (VENTOLIN HFA) 108 (90 Base) MCG/ACT inhaler, Inhale 2 puffs into the lungs every 4 (four) hours as needed for wheezing or shortness of breath., Disp: 18 g, Rfl: 1   Albuterol-Budesonide (AIRSUPRA) 90-80 MCG/ACT AERO, Inhale 2 puffs into the lungs every 4 (four) hours as needed., Disp: 10.7 g, Rfl: 1   amLODipine (NORVASC) 10 MG tablet, Take 1 tablet by mouth daily., Disp: , Rfl:    amLODipine (NORVASC) 10 MG tablet, TAKE 1 TABLET (10 MG TOTAL) BY MOUTH DAILY, Disp: 90 tablet, Rfl: 0   beclomethasone (QVAR REDIHALER) 80 MCG/ACT inhaler, During asthma flares take in addition to Symbicort inhaler 3 inhalations 3 times a day, Disp: 1 each, Rfl: 3   Bepotastine Besilate 1.5 % SOLN, Place 1 drop into both eyes 2 (two) times daily., Disp: 30 mL, Rfl: 1   BREZTRI AEROSPHERE  160-9-4.8 MCG/ACT AERO, 2 puffs 1-2 times daily., Disp: 3 each, Rfl: 1   cetirizine (ZYRTEC) 10 MG tablet, Take 1 tablet (10 mg total) by mouth daily as needed for allergies (Can take an extra dose during flare ups.)., Disp: 180 tablet, Rfl: 1   chlorthalidone (HYGROTON) 25 MG tablet, TAKE 1 TABLET (25 MG TOTAL) BY MOUTH DAILY. DX CODE: I10, Disp: 90 tablet, Rfl: 0   cyclobenzaprine (FLEXERIL) 5 MG tablet, Take 1 tablet (5 mg total) by mouth 3 (three) times daily as needed for muscle spasms., Disp: 30 tablet, Rfl: 1   doxycycline (VIBRAMYCIN) 100 MG capsule, Take 1 capsule (100 mg total) by mouth 2 (two) times daily., Disp: 20 capsule, Rfl: 0   famotidine (PEPCID) 40 MG tablet, Take 1 tablet (40 mg total) by mouth at bedtime., Disp: 90 tablet, Rfl: 1   latanoprost (XALATAN) 0.005 % ophthalmic solution, Place 1 drop into both eyes at bedtime., Disp: , Rfl:    levalbuterol (XOPENEX) 1.25 MG/3ML nebulizer solution, Take 1.25 mg by nebulization every 4 (four) hours as needed for wheezing., Disp: 90 mL, Rfl: 1   meloxicam (MOBIC) 15 MG tablet, Take 1 tablet (15 mg total) by mouth daily., Disp: 30 tablet, Rfl: 0   montelukast (SINGULAIR) 10 MG tablet, Take 1 tablet (10 mg total) by mouth at bedtime., Disp: 90 tablet, Rfl: 1   Omega  3-6-9 Fatty Acids (OMEGA 3-6-9 COMPLEX PO), Take 1 capsule by mouth in the morning. Krill Omega 50+ with CoQ10, Disp: , Rfl:    pantoprazole (PROTONIX) 40 MG tablet, Take 1 tablet (40 mg total) by mouth in the morning., Disp: 90 tablet, Rfl: 1   PARoxetine (PAXIL) 10 MG tablet, TAKE 1 TABLET BY MOUTH EVERY DAY, Disp: 90 tablet, Rfl: 1   SIMBRINZA 1-0.2 % SUSP, Place 1 drop into both eyes 2 (two) times daily., Disp: , Rfl:    telmisartan (MICARDIS) 80 MG tablet, TAKE 1 TABLET BY MOUTH EVERY DAY, Disp: 90 tablet, Rfl: 1   valACYclovir (VALTREX) 1000 MG tablet, TAKE 1 TABLET BY MOUTH TWICE A DAY, Disp: 180 tablet, Rfl: 1   vitamin B-12 (CYANOCOBALAMIN) 1000 MCG tablet, Take 1,000  mcg by mouth in the morning., Disp: , Rfl:    Objective:     There were no vitals filed for this visit.    There is no height or weight on file to calculate BMI.    Physical Exam:    ***   Electronically signed by:  Aleen Sells [facility] 12:34 PM 07/13/23

## 2023-07-14 ENCOUNTER — Ambulatory Visit: Payer: No Typology Code available for payment source

## 2023-07-14 ENCOUNTER — Ambulatory Visit: Payer: No Typology Code available for payment source | Admitting: Sports Medicine

## 2023-07-14 VITALS — BP 132/76 | HR 82 | Ht 69.0 in | Wt 242.0 lb

## 2023-07-14 DIAGNOSIS — G8929 Other chronic pain: Secondary | ICD-10-CM | POA: Diagnosis not present

## 2023-07-14 DIAGNOSIS — M25562 Pain in left knee: Secondary | ICD-10-CM

## 2023-07-14 DIAGNOSIS — M5441 Lumbago with sciatica, right side: Secondary | ICD-10-CM | POA: Diagnosis not present

## 2023-07-14 DIAGNOSIS — M545 Low back pain, unspecified: Secondary | ICD-10-CM

## 2023-07-14 NOTE — Patient Instructions (Signed)
PT referral  4 week follow up

## 2023-08-02 ENCOUNTER — Ambulatory Visit
Admission: RE | Admit: 2023-08-02 | Discharge: 2023-08-02 | Disposition: A | Discharge status: Home / Self Care | Payer: No Typology Code available for payment source | Source: Ambulatory Visit | Attending: Family Medicine | Admitting: Family Medicine

## 2023-08-02 DIAGNOSIS — Z1231 Encounter for screening mammogram for malignant neoplasm of breast: Secondary | ICD-10-CM

## 2023-08-02 NOTE — Therapy (Signed)
OUTPATIENT PHYSICAL THERAPY LOWER EXTREMITY EVALUATION   Patient Name: Amy Combs MRN: [id number] DOB:25-Apr-1961, 62 y.o., female Today's Date: 08/05/2023  END OF SESSION:  PT End of Session - 08/05/23 1515     Visit Number 1    Number of Visits 13    Date for PT Re-Evaluation 09/23/23    Authorization Type AET[employer] [place] PREFERRED    PT Start Time 0845    PT Stop Time 0930    PT Time Calculation (min) 45 min    Activity Tolerance Patient tolerated treatment well    Behavior During Therapy WFL for tasks assessed/performed             Past Medical History:  Diagnosis Date   Allergy    Ankle swelling    Asthma    GERD (gastroesophageal reflux disease)    Hypertension    Primary open angle glaucoma (POAG) of right eye, severe stage    Past Surgical History:  Procedure Laterality Date   ABDOMINAL SURGERY  1998   mesh placed for scar tissue   CESAREAN SECTION CLASSICAL  1990   COLONOSCOPY  06/06/2020   TUBAL LIGATION  1990   Patient Active Problem List   Diagnosis Date Noted   Seasonal and perennial allergic rhinitis 10/23/2021   Seasonal allergic conjunctivitis 10/23/2021   LPRD (laryngopharyngeal reflux disease) 02/17/2021   History of colon polyps 02/17/2021   Incomplete colonoscopy 02/17/2021   HSV-2 seropositive 04/23/2020   Syphilis 03/10/2020   Hyperkeratosis of cervix 02/20/2020   Essential hypertension 01/15/2020   Asthma, well controlled 01/15/2020   Arthritis of both knees 04/10/2018   Primary open angle glaucoma (POAG) of right eye, severe stage 01/02/2018   Combined form of age-related cataract, both eyes 09/30/2017    PCP: Georganna Skeans, MD   REFERRING PROVIDER: Richardean Sale, DO  REFERRING DIAG: M54.50 (ICD-10-CM) - Low back pain, unspecified back pain laterality, unspecified chronicity, unspecified whether sciatica present   THERAPY DIAG:  Other low back pain  Muscle weakness (generalized)  Difficulty in walking, not  elsewhere classified  Rationale for Evaluation and Treatment: Rehabilitation  ONSET DATE: Several years  SUBJECTIVE:   SUBJECTIVE STATEMENT: Pt reports chronic LBP/R gluteal pain for several years, but it has been worse since a fall in December of 2022. During this fall, the pt was running to her car in the rain and slipped falling on to her R side. The pain has not improved or worsened since the fall. Pt endorses N/T of the R thigh.  PERTINENT HISTORY: HTN  PAIN:  Are you having pain? Yes: NPRS scale: 5/10 Pain location: low back and R gluteal Pain description: ache, throb Aggravating factors: Prolonged walking, standing, and sitting  Relieving factors: tylenol, liying down, cold pack more so than a hot pack Pain range on eval:2-9/10  PRECAUTIONS: None  RED FLAGS: None   WEIGHT BEARING RESTRICTIONS: No  FALLS:  Has patient fallen in last 6 months? No  LIVING ENVIRONMENT: Lives with: lives alone Lives in: House/apartment No issue with accessing or mobility within home  OCCUPATION: Office/computer work  PLOF: Independent  PATIENT GOALS: Learn how to help to back and decrease the pain  NEXT MD VISIT: 08/11/23  OBJECTIVE:   DIAGNOSTIC FINDINGS: DG Lumbar Spine 05/05/23  FINDINGS: Compression deformity at L3 is new since 20/11 but age-indeterminate. Consider MRI for further evaluation. Disc space narrowing and marginal osteophytes noted consistent with degenerative disc disease L2-3. There is grade 1 L5 retrolisthesis. Facet joint degenerative changes L4-5  and L5-S1. No osteolytic or osteoblastic lesions.   IMPRESSION: Degenerative disc disease. Grade 1 L5 retrolisthesis. L3 compression deformity that is age-indeterminate.   PATIENT SURVEYS:  FOTO: Perceived function   50%, predicted   61%   COGNITION: Overall cognitive status: Within functional limits for tasks assessed     SENSATION: WFL  EDEMA:  NA  MUSCLE LENGTH: Hamstrings: Right tight; Left  tight Thomas test: Right tight ; Left tight   POSTURE: rounded shoulders, forward head, increased lumbar lordosis, anterior pelvic tilt, and flexed trunk   PALPATION: TTP to lumbar paraspinals and QL c increased muscle tightness  LUMBAR ROM:  Grossly WNLs Active  AROM  Eval  Flexion Mod limitation mid shin, lordosis does not fully decrease with flexion; pulling LBP  Extension Mod limitation; no LBP  Right lateral flexion Min limitation; knee jt line; R LBP  Left lateral flexion Min limitation, knee jt line; no LBP  Right rotation Full; no LBP  Left rotation Full; no LBP   (Blank rows = not tested)  LE ROM:  Grossly WNLs Active  Right Eval Left Eval  Hip flexion    Hip extension    Hip abduction    Hip adduction    Hip internal rotation    Hip external rotation    Knee flexion    Knee extension    Ankle dorsiflexion    Ankle plantarflexion    Ankle inversion    Ankle eversion     (Blank rows = not tested)  LE MMT:  Myotome screen negation c equal LE strength grossly WNLs, * Weak core MMT Right  Left   Hip flexion    Hip extension    Hip abduction    Hip adduction    Hip internal rotation    Hip external rotation    Knee flexion    Knee extension    Ankle dorsiflexion    Ankle plantarflexion    Ankle inversion    Ankle eversion     (Blank rows = not tested)  LUMBAR SPECIAL TESTS:  Straight leg raise test: Negative, Slump test: Negative, SI Compression/distraction test: Negative, and FABER test: Negative   GAIT: Distance walked: 200' Assistive device utilized: None Level of assistance: Complete Independence Comments: Decrease step length   TODAY'S TREATMENT:  [facility] Adult PT Treatment:                                                DATE: 08/04/23 Therapeutic Exercise: Developed, instructed in, and pt completed therex as noted in HEP Self Care: Use of cold or hot packs for pain management                                                                                                                               PATIENT EDUCATION:  Education details: Eval findings, POC, HEP, self care Person educated: Patient Education method: Explanation, Demonstration, Tactile cues, Verbal cues, and Handouts Education comprehension: verbalized understanding, returned demonstration, verbal cues required, tactile cues required, and needs further education  HOME EXERCISE PROGRAM: Access Code: [email or web address] URL: [email or web address] Date: 08/04/2023 Prepared by: Joellyn Rued  Exercises - Supine Single Knee to Chest Stretch  - 2 x daily - 7 x weekly - 1 sets - 3-5 reps - 20 hold - Standing 'L' Stretch at Counter  - 2 x daily - 7 x weekly - 1 sets - 10 reps - 20 hold - Supine Posterior Pelvic Tilt  - 2 x daily - 7 x weekly - 2 sets - 10 reps - 3 hold - Hooklying Clamshell with Resistance  - 2 x daily - 7 x weekly - 2 sets - 10 reps - 3 hold - Supine Bridge  - 2 x daily - 7 x weekly - 2 sets - 10 reps - 3 hold  ASSESSMENT:  CLINICAL IMPRESSION: Patient is a 62 y.o. female who was seen today for physical therapy evaluation and treatment for M54.50 (ICD-10-CM) - Low back pain, unspecified back pain laterality, unspecified chronicity, unspecified whether sciatica present.  Pt presents with decreased trunk ROM with lumbar lordosis not fully decreasing c flexion, tight hamstrings, hip flexors, lumbar paraspinals, and QL and a weak core. Pt was started on a HEP for these issues. Pt will benefit from skilled PT to address impairments to optimize back function with less pain.   OBJECTIVE IMPAIRMENTS: decreased activity tolerance, difficulty walking, decreased ROM, decreased strength, increased muscle spasms, impaired flexibility, postural dysfunction, obesity, and pain.   ACTIVITY LIMITATIONS: lifting, bending, sitting, standing, squatting, sleeping, stairs, and locomotion level  PARTICIPATION LIMITATIONS: meal prep, cleaning, laundry, driving, shopping,  community activity, and occupation  PERSONAL FACTORS: Fitness, Past/current experiences, Profession, and Time since onset of injury/illness/exacerbation are also affecting patient's functional outcome.   REHAB POTENTIAL: Good  CLINICAL DECISION MAKING: Stable/uncomplicated  EVALUATION COMPLEXITY: Low   GOALS:  SHORT TERM GOALS: Target date: 08/26/23 Pt will be Ind in an initial HEP Baseline: Started Goal status: INITIAL  2.  Pt will voice understanding of measures to assist in pain reduction  Baseline: Started Goal status: INITIAL  LONG TERM GOALS: Target date: 09/23/23  Pt will be Ind in a final HEP to maintain achieved LOF  Baseline: started Goal status: INITIAL  2.  Increase trunk flex, ext to min limitations for improved function Baseline: Mod limitations Goal status: INITIAL  3.  Pt will demonstrate improved core strength maintaining a bridge for 60" and a forward plank from toes for 30" Baseline:  Goal status: INITIAL  4.  Pt will report a 50% or greater reduction in LBP with daily activities for improved function and QOL  Baseline: 2-9/10 Goal status: INITIAL  5.  Pt's FOTO score will improved to the predicted value of 61% as indication of improved function  Baseline: 50% Goal status: INITIAL   PLAN:  PT FREQUENCY: 2x/week  PT DURATION: 6 weeks  PLANNED INTERVENTIONS: Therapeutic exercises, Therapeutic activity, Patient/Family education, Self Care, Joint mobilization, Aquatic Therapy, Dry Needling, Electrical stimulation, Spinal mobilization, Cryotherapy, Moist heat, Taping, Traction, Ultrasound, Ionotophoresis 4mg /ml Dexamethasone, Manual therapy, and Re-evaluation  PLAN FOR NEXT SESSION: Review FOTO; assess response to HEP; progress therex as indicated; use of modalities, manual therapy; and TPDN as indicated.  Shizuye Rupert MS, PT 08/06/23 9:11 AM

## 2023-08-04 ENCOUNTER — Other Ambulatory Visit: Payer: Self-pay

## 2023-08-04 ENCOUNTER — Ambulatory Visit: Payer: No Typology Code available for payment source | Attending: Sports Medicine

## 2023-08-04 DIAGNOSIS — R262 Difficulty in walking, not elsewhere classified: Secondary | ICD-10-CM | POA: Diagnosis present

## 2023-08-04 DIAGNOSIS — M545 Low back pain, unspecified: Secondary | ICD-10-CM | POA: Insufficient documentation

## 2023-08-04 DIAGNOSIS — M5459 Other low back pain: Secondary | ICD-10-CM | POA: Diagnosis present

## 2023-08-04 DIAGNOSIS — M6281 Muscle weakness (generalized): Secondary | ICD-10-CM | POA: Diagnosis present

## 2023-08-05 NOTE — Progress Notes (Unsigned)
Aleen Sells D[facility] [address] [place] [address] Phone: [phone]   Assessment and Plan:     There are no diagnoses linked to this encounter.  ***   Pertinent previous records reviewed include ***   Follow Up: ***     Subjective:   I, Amy Combs, am serving as a Neurosurgeon for Doctor Richardean Sale   Chief Complaint: low back pain    HPI:    05/05/2023 Patient is a 62 year old female complaining of low back pain. Patient states that she fell a year ago, she was told that she had sciatica, pain is worse on the right , pain radiates down the leg and the leg feels numb sometimes, motrin for the pain and that helped a little, Aspercreme helps as well, she uses a be active knee brace and that really helps with her leg pain,    06/02/2023 Patient states she is okay has some pain but it is intermittent    07/14/2023 Patient states that her back pain remains at an 8/10.    Also c/o L knee pain. Was seeing [facility] 5 years ago and she received cortisone injections and visco supplementation. Both were helpful. Patient trip over her rug 2x recently and feels she aggravated symptoms. Pain on both anterior and posterior aspect which is worse with sit to stand. Notes a tightness with TKE when walking. Having hard time sleeping due to pain.    08/11/2023 Patient states    Relevant Historical Information: Hypertension,  Additional pertinent review of systems negative.   Current Outpatient Medications:    albuterol (PROVENTIL) (2.5 MG/3ML) 0.083% nebulizer solution, , Disp: , Rfl:    albuterol (VENTOLIN HFA) 108 (90 Base) MCG/ACT inhaler, Inhale 2 puffs into the lungs every 4 (four) hours as needed for wheezing or shortness of breath., Disp: 18 g, Rfl: 1   Albuterol-Budesonide (AIRSUPRA) 90-80 MCG/ACT AERO, Inhale 2 puffs into the lungs every 4 (four) hours as needed., Disp: 10.7 g, Rfl: 1   amLODipine (NORVASC) 10 MG tablet, Take  1 tablet by mouth daily., Disp: , Rfl:    amLODipine (NORVASC) 10 MG tablet, TAKE 1 TABLET (10 MG TOTAL) BY MOUTH DAILY, Disp: 90 tablet, Rfl: 0   beclomethasone (QVAR REDIHALER) 80 MCG/ACT inhaler, During asthma flares take in addition to Symbicort inhaler 3 inhalations 3 times a day, Disp: 1 each, Rfl: 3   Bepotastine Besilate 1.5 % SOLN, Place 1 drop into both eyes 2 (two) times daily., Disp: 30 mL, Rfl: 1   BREZTRI AEROSPHERE 160-9-4.8 MCG/ACT AERO, 2 puffs 1-2 times daily., Disp: 3 each, Rfl: 1   cetirizine (ZYRTEC) 10 MG tablet, Take 1 tablet (10 mg total) by mouth daily as needed for allergies (Can take an extra dose during flare ups.)., Disp: 180 tablet, Rfl: 1   chlorthalidone (HYGROTON) 25 MG tablet, TAKE 1 TABLET (25 MG TOTAL) BY MOUTH DAILY. DX CODE: I10, Disp: 90 tablet, Rfl: 0   cyclobenzaprine (FLEXERIL) 5 MG tablet, Take 1 tablet (5 mg total) by mouth 3 (three) times daily as needed for muscle spasms., Disp: 30 tablet, Rfl: 1   doxycycline (VIBRAMYCIN) 100 MG capsule, Take 1 capsule (100 mg total) by mouth 2 (two) times daily., Disp: 20 capsule, Rfl: 0   famotidine (PEPCID) 40 MG tablet, Take 1 tablet (40 mg total) by mouth at bedtime., Disp: 90 tablet, Rfl: 1   latanoprost (XALATAN) 0.005 % ophthalmic solution, Place 1 drop into  both eyes at bedtime., Disp: , Rfl:    levalbuterol (XOPENEX) 1.25 MG/3ML nebulizer solution, Take 1.25 mg by nebulization every 4 (four) hours as needed for wheezing., Disp: 90 mL, Rfl: 1   meloxicam (MOBIC) 15 MG tablet, Take 1 tablet (15 mg total) by mouth daily., Disp: 30 tablet, Rfl: 0   montelukast (SINGULAIR) 10 MG tablet, Take 1 tablet (10 mg total) by mouth at bedtime., Disp: 90 tablet, Rfl: 1   Omega 3-6-9 Fatty Acids (OMEGA 3-6-9 COMPLEX PO), Take 1 capsule by mouth in the morning. Krill Omega 50+ with CoQ10, Disp: , Rfl:    pantoprazole (PROTONIX) 40 MG tablet, Take 1 tablet (40 mg total) by mouth in the morning., Disp: 90 tablet, Rfl: 1    PARoxetine (PAXIL) 10 MG tablet, TAKE 1 TABLET BY MOUTH EVERY DAY, Disp: 90 tablet, Rfl: 1   SIMBRINZA 1-0.2 % SUSP, Place 1 drop into both eyes 2 (two) times daily., Disp: , Rfl:    telmisartan (MICARDIS) 80 MG tablet, TAKE 1 TABLET BY MOUTH EVERY DAY, Disp: 90 tablet, Rfl: 1   valACYclovir (VALTREX) 1000 MG tablet, TAKE 1 TABLET BY MOUTH TWICE A DAY, Disp: 180 tablet, Rfl: 1   vitamin B-12 (CYANOCOBALAMIN) 1000 MCG tablet, Take 1,000 mcg by mouth in the morning., Disp: , Rfl:    Objective:     There were no vitals filed for this visit.    There is no height or weight on file to calculate BMI.    Physical Exam:    ***   Electronically signed by:  Aleen Sells [facility] 7:44 AM 08/05/23

## 2023-08-11 ENCOUNTER — Ambulatory Visit: Payer: No Typology Code available for payment source | Admitting: Sports Medicine

## 2023-08-11 VITALS — BP 138/82 | HR 79 | Ht 69.0 in | Wt 239.0 lb

## 2023-08-11 DIAGNOSIS — M5441 Lumbago with sciatica, right side: Secondary | ICD-10-CM

## 2023-08-11 DIAGNOSIS — G8929 Other chronic pain: Secondary | ICD-10-CM | POA: Diagnosis not present

## 2023-08-11 DIAGNOSIS — M25562 Pain in left knee: Secondary | ICD-10-CM

## 2023-08-11 NOTE — Patient Instructions (Signed)
Good to see you   Follow up in  

## 2023-08-30 ENCOUNTER — Ambulatory Visit: Payer: No Typology Code available for payment source

## 2023-09-01 ENCOUNTER — Encounter: Payer: Self-pay | Admitting: Family Medicine

## 2023-09-01 ENCOUNTER — Ambulatory Visit: Payer: No Typology Code available for payment source | Admitting: Family Medicine

## 2023-09-01 VITALS — BP 129/83 | HR 78 | Temp 98.0°F | Resp 16 | Ht 69.0 in | Wt 240.0 lb

## 2023-09-01 DIAGNOSIS — I10 Essential (primary) hypertension: Secondary | ICD-10-CM

## 2023-09-01 DIAGNOSIS — Z23 Encounter for immunization: Secondary | ICD-10-CM | POA: Diagnosis not present

## 2023-09-01 DIAGNOSIS — E559 Vitamin D deficiency, unspecified: Secondary | ICD-10-CM

## 2023-09-01 DIAGNOSIS — Z6835 Body mass index (BMI) 35.0-35.9, adult: Secondary | ICD-10-CM

## 2023-09-01 DIAGNOSIS — J4520 Mild intermittent asthma, uncomplicated: Secondary | ICD-10-CM | POA: Diagnosis not present

## 2023-09-01 DIAGNOSIS — E785 Hyperlipidemia, unspecified: Secondary | ICD-10-CM

## 2023-09-01 DIAGNOSIS — E669 Obesity, unspecified: Secondary | ICD-10-CM

## 2023-09-01 MED ORDER — VITAMIN D (ERGOCALCIFEROL) 1.25 MG (50000 UNIT) PO CAPS: 50000.0000 [IU] | ORAL_CAPSULE | ORAL | 0 refills | Status: AC

## 2023-09-01 NOTE — Progress Notes (Signed)
Established Patient Office Visit  Subjective    Patient ID: Amy Combs, female    DOB: 1961/03/23  Age: 62 y.o. MRN: [id number]  CC:  Chief Complaint  Patient presents with   Follow-up    HPI Amy Combs presents for follow up of chronic med issues. Patient denies acute complaints or concerns.   Outpatient Encounter Medications as of 09/01/2023  Medication Sig   albuterol (PROVENTIL) (2.5 MG/3ML) 0.083% nebulizer solution    albuterol (VENTOLIN HFA) 108 (90 Base) MCG/ACT inhaler Inhale 2 puffs into the lungs every 4 (four) hours as needed for wheezing or shortness of breath.   Albuterol-Budesonide (AIRSUPRA) 90-80 MCG/ACT AERO Inhale 2 puffs into the lungs every 4 (four) hours as needed.   amLODipine (NORVASC) 10 MG tablet Take 1 tablet by mouth daily.   amLODipine (NORVASC) 10 MG tablet TAKE 1 TABLET (10 MG TOTAL) BY MOUTH DAILY   beclomethasone (QVAR REDIHALER) 80 MCG/ACT inhaler During asthma flares take in addition to Symbicort inhaler 3 inhalations 3 times a day   Bepotastine Besilate 1.5 % SOLN Place 1 drop into both eyes 2 (two) times daily.   BREZTRI AEROSPHERE 160-9-4.8 MCG/ACT AERO 2 puffs 1-2 times daily.   cetirizine (ZYRTEC) 10 MG tablet Take 1 tablet (10 mg total) by mouth daily as needed for allergies (Can take an extra dose during flare ups.).   chlorthalidone (HYGROTON) 25 MG tablet TAKE 1 TABLET (25 MG TOTAL) BY MOUTH DAILY. DX CODE: I10   cyclobenzaprine (FLEXERIL) 5 MG tablet Take 1 tablet (5 mg total) by mouth 3 (three) times daily as needed for muscle spasms.   doxycycline (VIBRAMYCIN) 100 MG capsule Take 1 capsule (100 mg total) by mouth 2 (two) times daily.   famotidine (PEPCID) 40 MG tablet Take 1 tablet (40 mg total) by mouth at bedtime.   latanoprost (XALATAN) 0.005 % ophthalmic solution Place 1 drop into both eyes at bedtime.   levalbuterol (XOPENEX) 1.25 MG/3ML nebulizer solution Take 1.25 mg by nebulization every 4 (four) hours as needed for  wheezing.   meloxicam (MOBIC) 15 MG tablet Take 1 tablet (15 mg total) by mouth daily.   montelukast (SINGULAIR) 10 MG tablet Take 1 tablet (10 mg total) by mouth at bedtime.   Omega 3-6-9 Fatty Acids (OMEGA 3-6-9 COMPLEX PO) Take 1 capsule by mouth in the morning. Krill Omega 50+ with CoQ10   pantoprazole (PROTONIX) 40 MG tablet Take 1 tablet (40 mg total) by mouth in the morning.   PARoxetine (PAXIL) 10 MG tablet TAKE 1 TABLET BY MOUTH EVERY DAY   SIMBRINZA 1-0.2 % SUSP Place 1 drop into both eyes 2 (two) times daily.   telmisartan (MICARDIS) 80 MG tablet TAKE 1 TABLET BY MOUTH EVERY DAY   valACYclovir (VALTREX) 1000 MG tablet TAKE 1 TABLET BY MOUTH TWICE A DAY   vitamin B-12 (CYANOCOBALAMIN) 1000 MCG tablet Take 1,000 mcg by mouth in the morning.   No facility-administered encounter medications on file as of 09/01/2023.    Past Medical History:  Diagnosis Date   Allergy    Ankle swelling    Asthma    GERD (gastroesophageal reflux disease)    Hypertension    Primary open angle glaucoma (POAG) of right eye, severe stage     Past Surgical History:  Procedure Laterality Date   ABDOMINAL SURGERY  1998   mesh placed for scar tissue   CESAREAN SECTION CLASSICAL  1990   COLONOSCOPY  06/06/2020   TUBAL LIGATION  1990  Family History  Problem Relation Age of Onset   High blood pressure Mother    Cataracts Mother    Glaucoma Mother    High blood pressure Father    Cataracts Maternal Grandfather    Glaucoma Maternal Grandfather    Breast cancer Paternal Aunt    Colon cancer Neg Hx    Esophageal cancer Neg Hx    Rectal cancer Neg Hx    Stomach cancer Neg Hx    Inflammatory bowel disease Neg Hx    Liver disease Neg Hx    Pancreatic cancer Neg Hx     Social History   Socioeconomic History   Marital status: Single    Spouse name: Not on file   Number of children: Not on file   Years of education: Not on file   Highest education level: Not on file  Occupational History    Not on file  Tobacco Use   Smoking status: Never   Smokeless tobacco: Never  Vaping Use   Vaping status: Never Used  Substance and Sexual Activity   Alcohol use: Yes    Comment: occasional   Drug use: Not Currently    Types: Marijuana   Sexual activity: Not on file  Other Topics Concern   Not on file  Social History Narrative   Not on file   Social Determinants of Health   Financial Resource Strain: Low Risk  (09/01/2023)   Overall Financial Resource Strain (CARDIA)    Difficulty of Paying Living Expenses: Not hard at all  Food Insecurity: No Food Insecurity (09/01/2023)   Hunger Vital Sign    Worried About Running Out of Food in the Last Year: Never true    Ran Out of Food in the Last Year: Never true  Transportation Needs: No Transportation Needs (09/01/2023)   PRAPARE - Administrator, Civil Service (Medical): No    Lack of Transportation (Non-Medical): No  Physical Activity: Insufficiently Active (09/01/2023)   Exercise Vital Sign    Days of Exercise per Week: 3 days    Minutes of Exercise per Session: 30 min  Stress: Stress Concern Present (09/01/2023)   [employer] of Occupational Health - Occupational Stress Questionnaire    Feeling of Stress : To some extent  Social Connections: Moderately Isolated (09/01/2023)   Social Connection and Isolation Panel [NHANES]    Frequency of Communication with Friends and Family: More than three times a week    Frequency of Social Gatherings with Friends and Family: Once a week    Attends Religious Services: More than 4 times per year    Active Member of [employer] or Organizations: No    Attends Banker Meetings: Never    Marital Status: Never married  Intimate Partner Violence: Not At Risk (09/01/2023)   Humiliation, Afraid, Rape, and Kick questionnaire    Fear of Current or Ex-Partner: No    Emotionally Abused: No    Physically Abused: No    Sexually Abused: No    Review of Systems  All other  systems reviewed and are negative.       Objective    BP (!) 141/84 (BP Location: Right Arm, Patient Position: Sitting, Cuff Size: Large)   Pulse 78   Temp 98 F (36.7 C) (Oral)   Resp 16   Ht 5\' 9"  (1.753 m)   Wt 240 lb (108.9 kg)   LMP 02/27/2016   SpO2 98%   BMI 35.44 kg/m   Physical Exam Vitals  and nursing note reviewed.  Constitutional:      General: She is not in acute distress.    Appearance: She is obese.  Cardiovascular:     Rate and Rhythm: Normal rate and regular rhythm.  Pulmonary:     Effort: Pulmonary effort is normal.     Breath sounds: Normal breath sounds.  Abdominal:     Palpations: Abdomen is soft.     Tenderness: There is no abdominal tenderness.  Neurological:     General: No focal deficit present.     Mental Status: She is alert and oriented to person, place, and time.         Assessment & Plan:   1. Vitamin D deficiency   2. Essential hypertension   3. Hyperlipidemia, unspecified hyperlipidemia type   4. Asthma, mild intermittent, well-controlled   5. Encounter for immunization  - Flu vaccine trivalent PF, 6mos and older(Flulaval,Afluria,Fluarix,Fluzone)    No follow-ups on file.   Tommie Raymond, MD

## 2023-09-01 NOTE — Progress Notes (Signed)
Follow up.

## 2023-09-02 ENCOUNTER — Encounter: Payer: Self-pay | Admitting: Family Medicine

## 2023-09-06 ENCOUNTER — Ambulatory Visit: Payer: No Typology Code available for payment source

## 2023-09-08 ENCOUNTER — Ambulatory Visit: Payer: No Typology Code available for payment source

## 2023-09-10 ENCOUNTER — Other Ambulatory Visit: Payer: Self-pay | Admitting: Family Medicine

## 2023-09-27 ENCOUNTER — Other Ambulatory Visit: Payer: Self-pay | Admitting: Family Medicine

## 2023-09-29 ENCOUNTER — Other Ambulatory Visit: Payer: Self-pay | Admitting: Family Medicine

## 2023-09-29 DIAGNOSIS — I10 Essential (primary) hypertension: Secondary | ICD-10-CM

## 2023-11-01 ENCOUNTER — Other Ambulatory Visit: Payer: Self-pay

## 2023-11-01 ENCOUNTER — Ambulatory Visit: Payer: No Typology Code available for payment source | Admitting: Allergy and Immunology

## 2023-11-01 ENCOUNTER — Encounter: Payer: Self-pay | Admitting: Allergy and Immunology

## 2023-11-01 VITALS — BP 138/64 | HR 92 | Temp 98.1°F | Resp 18 | Ht 69.0 in | Wt 241.7 lb

## 2023-11-01 DIAGNOSIS — J4540 Moderate persistent asthma, uncomplicated: Secondary | ICD-10-CM | POA: Diagnosis not present

## 2023-11-01 DIAGNOSIS — J302 Other seasonal allergic rhinitis: Secondary | ICD-10-CM

## 2023-11-01 DIAGNOSIS — H1013 Acute atopic conjunctivitis, bilateral: Secondary | ICD-10-CM | POA: Diagnosis not present

## 2023-11-01 DIAGNOSIS — J3089 Other allergic rhinitis: Secondary | ICD-10-CM

## 2023-11-01 DIAGNOSIS — K219 Gastro-esophageal reflux disease without esophagitis: Secondary | ICD-10-CM

## 2023-11-01 DIAGNOSIS — H1010 Acute atopic conjunctivitis, unspecified eye: Secondary | ICD-10-CM

## 2023-11-01 MED ORDER — AIRSUPRA 90-80 MCG/ACT IN AERO: 2.0000 | INHALATION_SPRAY | RESPIRATORY_TRACT | 1 refills | Status: DC | PRN

## 2023-11-01 MED ORDER — MONTELUKAST SODIUM 10 MG PO TABS: 10.0000 mg | ORAL_TABLET | Freq: Every evening | ORAL | 1 refills | Status: DC

## 2023-11-01 MED ORDER — BEPOTASTINE BESILATE 1.5 % OP SOLN: 1.0000 [drp] | Freq: Two times a day (BID) | OPHTHALMIC | 4 refills | Status: AC | PRN

## 2023-11-01 MED ORDER — BREZTRI AEROSPHERE 160-9-4.8 MCG/ACT IN AERO: INHALATION_SPRAY | RESPIRATORY_TRACT | 2 refills | Status: DC

## 2023-11-01 MED ORDER — BREZTRI AEROSPHERE 160-9-4.8 MCG/ACT IN AERO: 2.0000 | INHALATION_SPRAY | Freq: Two times a day (BID) | RESPIRATORY_TRACT | 3 refills | Status: DC

## 2023-11-01 MED ORDER — PANTOPRAZOLE SODIUM 40 MG PO TBEC: 40.0000 mg | DELAYED_RELEASE_TABLET | Freq: Every morning | ORAL | 1 refills | Status: DC

## 2023-11-01 MED ORDER — FAMOTIDINE 40 MG PO TABS: 40.0000 mg | ORAL_TABLET | Freq: Every evening | ORAL | 1 refills | Status: DC

## 2023-11-01 NOTE — Progress Notes (Unsigned)
[place] - [place] - [place] - [place] - [place]   Follow-up Note  Referring Provider: Georganna Skeans, MD Primary Provider: Georganna Skeans, MD Date of Office Visit: 11/01/2023  Subjective:   Amy Combs (DOB: 09-27-1961) is a 62 y.o. female who [facility] in re-evaluation of the following:  HPI: Amy Combs returns to this clinic in evaluation of asthma, allergic rhinoconjunctivitis, reflux.  I last saw her in this clinic 26 Apr 2023.  Her airway issues have been under excellent control since her last visit and she has not required a systemic steroid or antibiotic for any type of airway issue and she rarely uses a short acting bronchodilator while she continues on Breztri and montelukast on a consistent basis.  Her reflux has also been under excellent control as long as she remains on a proton pump inhibitor and an H2 receptor blocker.  She is careful about consuming caffeine and chocolate and alcohol.  She has obtained this years flu vaccine.  Allergies as of 11/01/2023       Reactions   Other    Other reaction(s): Eye Redness Dust, ragweed, grass   Pollen Extract Other (See Comments)   Lisinopril Swelling        Medication List    Airsupra 90-80 MCG/ACT Aero Generic drug: Albuterol-Budesonide Inhale 2 puffs into the lungs every 4 (four) hours as needed.   albuterol (2.5 MG/3ML) 0.083% nebulizer solution Commonly known as: PROVENTIL   albuterol 108 (90 Base) MCG/ACT inhaler Commonly known as: VENTOLIN HFA Inhale 2 puffs into the lungs every 4 (four) hours as needed for wheezing or shortness of breath.   amLODipine 10 MG tablet Commonly known as: NORVASC TAKE 1 TABLET (10 MG TOTAL) BY MOUTH DAILY   Bepotastine Besilate 1.5 % Soln Place 1 drop into both eyes 2 (two) times daily.   Breztri Aerosphere 160-9-4.8 MCG/ACT Aero Generic drug: Budeson-Glycopyrrol-Formoterol 2 puffs 1-2 times daily.   brimonidine  0.2 % ophthalmic solution Commonly known as: ALPHAGAN Place 1 drop into the right eye 2 (two) times daily.   cetirizine 10 MG tablet Commonly known as: ZYRTEC Take 1 tablet (10 mg total) by mouth daily as needed for allergies (Can take an extra dose during flare ups.).   chlorthalidone 25 MG tablet Commonly known as: HYGROTON TAKE 1 TABLET (25 MG TOTAL) BY MOUTH DAILY. DX CODE: I10   cyanocobalamin 1000 MCG tablet Commonly known as: VITAMIN B12 Take 1,000 mcg by mouth in the morning.   cyclobenzaprine 5 MG tablet Commonly known as: FLEXERIL Take 1 tablet (5 mg total) by mouth 3 (three) times daily as needed for muscle spasms.   dorzolamide 2 % ophthalmic solution Commonly known as: TRUSOPT Place 1 drop into the right eye 3 (three) times daily.   famotidine 40 MG tablet Commonly known as: PEPCID Take 1 tablet (40 mg total) by mouth at bedtime.   levalbuterol 1.25 MG/3ML nebulizer solution Commonly known as: XOPENEX Take 1.25 mg by nebulization every 4 (four) hours as needed for wheezing.   meloxicam 15 MG tablet Commonly known as: MOBIC Take 1 tablet (15 mg total) by mouth daily.   montelukast 10 MG tablet Commonly known as: SINGULAIR Take 1 tablet (10 mg total) by mouth at bedtime.   OMEGA 3-6-9 COMPLEX PO Take 1 capsule by mouth in the morning. Krill Omega 50+ with CoQ10   pantoprazole 40 MG tablet Commonly known as: PROTONIX Take 1 tablet (40 mg total) by mouth  in the morning.   PARoxetine 10 MG tablet Commonly known as: PAXIL TAKE 1 TABLET BY MOUTH EVERY DAY   Qvar RediHaler 80 MCG/ACT inhaler Generic drug: beclomethasone During asthma flares take in addition to Symbicort inhaler 3 inhalations 3 times a day   telmisartan 80 MG tablet Commonly known as: MICARDIS TAKE 1 TABLET BY MOUTH EVERY DAY   valACYclovir 1000 MG tablet Commonly known as: VALTREX TAKE 1 TABLET BY MOUTH TWICE A DAY   Vitamin D (Ergocalciferol) 1.25 MG (50000 UNIT) Caps  capsule Commonly known as: DRISDOL Take 1 capsule (50,000 Units total) by mouth every 7 (seven) days.    Past Medical History:  Diagnosis Date   Allergy    Ankle swelling    Asthma    GERD (gastroesophageal reflux disease)    Hypertension    Primary open angle glaucoma (POAG) of right eye, severe stage     Past Surgical History:  Procedure Laterality Date   ABDOMINAL SURGERY  1998   mesh placed for scar tissue   CESAREAN SECTION CLASSICAL  1990   COLONOSCOPY  06/06/2020   TUBAL LIGATION  1990    Review of systems negative except as noted in HPI / PMHx or noted below:  Review of Systems  Constitutional: Negative.   HENT: Negative.    Eyes: Negative.   Respiratory: Negative.    Cardiovascular: Negative.   Gastrointestinal: Negative.   Genitourinary: Negative.   Musculoskeletal: Negative.   Skin: Negative.   Neurological: Negative.   Endo/Heme/Allergies: Negative.   Psychiatric/Behavioral: Negative.       Objective:   Vitals:   11/01/23 1601  BP: 138/64  Pulse: 92  Resp: 18  Temp: 98.1 F (36.7 C)  SpO2: 94%   Height: 5\' 9"  (175.3 cm)  Weight: 241 lb 11.2 oz (109.6 kg)   Physical Exam Constitutional:      Appearance: She is not diaphoretic.  HENT:     Head: Normocephalic.     Right Ear: Tympanic membrane, ear canal and external ear normal.     Left Ear: Tympanic membrane, ear canal and external ear normal.     Nose: Nose normal. No mucosal edema or rhinorrhea.     Mouth/Throat:     Pharynx: Uvula midline. No oropharyngeal exudate.  Eyes:     Conjunctiva/sclera: Conjunctivae normal.  Neck:     Thyroid: No thyromegaly.     Trachea: Trachea normal. No tracheal tenderness or tracheal deviation.  Cardiovascular:     Rate and Rhythm: Normal rate and regular rhythm.     Heart sounds: Normal heart sounds, S1 normal and S2 normal. No murmur heard. Pulmonary:     Effort: No respiratory distress.     Breath sounds: Normal breath sounds. No stridor. No  wheezing or rales.  Lymphadenopathy:     Head:     Right side of head: No tonsillar adenopathy.     Left side of head: No tonsillar adenopathy.     Cervical: No cervical adenopathy.  Skin:    Findings: No erythema or rash.     Nails: There is no clubbing.  Neurological:     Mental Status: She is alert.     Diagnostics: Spirometry was performed and demonstrated an FEV1 of 2.12 at 85 % of predicted.  Assessment and Plan:   1. Asthma, moderate persistent, well-controlled   2. Seasonal and perennial allergic rhinitis   3. Seasonal allergic conjunctivitis   4. LPRD (laryngopharyngeal reflux disease)    1. Continue  Breztri - 2 inhalations twice a day   2. Continue Montelukast 10 mg daily  3. Continue Pantoprazole 40 mg in the morning + famotidine 40  mg in evening  4. If Needed:  A. AIRSUPRA- 2 inhalations or albuterol nebulization every 4-6 hours  B. OTC antihistamine  C. Bepreve eye drops   5. Return to clinic in 12 months or earlier if problem  Shylene is really doing very well and she has a very good understanding of her disease state and how her medications work and appropriate dosing of her medications depending on disease activity and if there is a problem over the course of the next 12 months she can contact me for further evaluation and treatment but otherwise she is going to remain on anti-inflammatory agents for airway and a proton pump inhibitor and H2 receptor blocker for reflux.  Laurette Schimke, MD Allergy / Immunology [facility]

## 2023-11-01 NOTE — Patient Instructions (Addendum)
  1. Continue Breztri - 2 inhalations twice a day   2. Continue Montelukast 10 mg daily  3. Continue Pantoprazole 40 mg in the morning + famotidine 40  mg in evening  4. If Needed:  A. AIRSUPRA- 2 inhalations or albuterol nebulization every 4-6 hours  B. OTC antihistamine  C. Bepreve eye drops   5. Return to clinic in 12 months or earlier if problem

## 2023-11-02 ENCOUNTER — Encounter: Payer: Self-pay | Admitting: Allergy and Immunology

## 2023-12-01 ENCOUNTER — Ambulatory Visit (INDEPENDENT_AMBULATORY_CARE_PROVIDER_SITE_OTHER): Payer: No Typology Code available for payment source | Admitting: Family Medicine

## 2023-12-01 ENCOUNTER — Encounter: Payer: Self-pay | Admitting: Family Medicine

## 2023-12-01 VITALS — BP 149/80 | HR 72 | Temp 98.1°F | Resp 16 | Ht 69.0 in | Wt 238.2 lb

## 2023-12-01 DIAGNOSIS — R21 Rash and other nonspecific skin eruption: Secondary | ICD-10-CM

## 2023-12-01 DIAGNOSIS — B029 Zoster without complications: Secondary | ICD-10-CM | POA: Diagnosis not present

## 2023-12-01 DIAGNOSIS — E559 Vitamin D deficiency, unspecified: Secondary | ICD-10-CM | POA: Diagnosis not present

## 2023-12-01 DIAGNOSIS — I10 Essential (primary) hypertension: Secondary | ICD-10-CM

## 2023-12-01 MED ORDER — AMLODIPINE BESYLATE 10 MG PO TABS: 10.0000 mg | ORAL_TABLET | Freq: Every day | ORAL | 1 refills | Status: DC

## 2023-12-01 MED ORDER — CHLORTHALIDONE 25 MG PO TABS: 25.0000 mg | ORAL_TABLET | Freq: Every day | ORAL | 1 refills | Status: DC

## 2023-12-01 MED ORDER — VALACYCLOVIR HCL 1 G PO TABS
1000.0000 mg | ORAL_TABLET | Freq: Two times a day (BID) | ORAL | 1 refills | Status: DC
Start: 2023-12-01 — End: 2024-05-04

## 2023-12-01 MED ORDER — TELMISARTAN 80 MG PO TABS
80.0000 mg | ORAL_TABLET | Freq: Every day | ORAL | 1 refills | Status: DC
Start: 2023-12-01 — End: 2024-11-01

## 2023-12-02 LAB — VITAMIN D 25 HYDROXY (VIT D DEFICIENCY, FRACTURES): Vit D, 25-Hydroxy: 37.8 ng/mL (ref 30.0–100.0)

## 2023-12-05 NOTE — Progress Notes (Signed)
Established Patient Office Visit  Subjective    Patient ID: Amy Combs, female    DOB: 12-06-1961  Age: 62 y.o. MRN: [id number]  CC:  Chief Complaint  Patient presents with   Follow-up    3 month, labs, medication refills    HPI Amy Combs presents for follow up of  hypertension and vitamin d deficiency. She also remains on daily suppressive therapy for herpes.   Outpatient Encounter Medications as of 12/01/2023  Medication Sig   albuterol (PROVENTIL) (2.5 MG/3ML) 0.083% nebulizer solution    albuterol (VENTOLIN HFA) 108 (90 Base) MCG/ACT inhaler Inhale 2 puffs into the lungs every 4 (four) hours as needed for wheezing or shortness of breath.   Albuterol-Budesonide (AIRSUPRA) 90-80 MCG/ACT AERO Inhale 2 puffs into the lungs every 4 (four) hours as needed.   beclomethasone (QVAR REDIHALER) 80 MCG/ACT inhaler During asthma flares take in addition to Symbicort inhaler 3 inhalations 3 times a day   Bepotastine Besilate 1.5 % SOLN Place 1 drop into both eyes 2 (two) times daily as needed.   BREZTRI AEROSPHERE 160-9-4.8 MCG/ACT AERO Inhale 2 puffs into the lungs 2 (two) times daily. 2 inhalations 2(TWO) Times a day.   brimonidine (ALPHAGAN) 0.2 % ophthalmic solution Place 1 drop into the right eye 2 (two) times daily.   cetirizine (ZYRTEC) 10 MG tablet Take 1 tablet (10 mg total) by mouth daily as needed for allergies (Can take an extra dose during flare ups.).   cyclobenzaprine (FLEXERIL) 5 MG tablet Take 1 tablet (5 mg total) by mouth 3 (three) times daily as needed for muscle spasms.   dorzolamide (TRUSOPT) 2 % ophthalmic solution Place 1 drop into the right eye 3 (three) times daily.   famotidine (PEPCID) 40 MG tablet Take 1 tablet (40 mg total) by mouth at bedtime.   levalbuterol (XOPENEX) 1.25 MG/3ML nebulizer solution Take 1.25 mg by nebulization every 4 (four) hours as needed for wheezing.   meloxicam (MOBIC) 15 MG tablet Take 1 tablet (15 mg total) by mouth daily.    montelukast (SINGULAIR) 10 MG tablet Take 1 tablet (10 mg total) by mouth at bedtime.   Omega 3-6-9 Fatty Acids (OMEGA 3-6-9 COMPLEX PO) Take 1 capsule by mouth in the morning. Krill Omega 50+ with CoQ10   pantoprazole (PROTONIX) 40 MG tablet Take 1 tablet (40 mg total) by mouth in the morning.   PARoxetine (PAXIL) 10 MG tablet TAKE 1 TABLET BY MOUTH EVERY DAY   vitamin B-12 (CYANOCOBALAMIN) 1000 MCG tablet Take 1,000 mcg by mouth in the morning.   Vitamin D, Ergocalciferol, (DRISDOL) 1.25 MG (50000 UNIT) CAPS capsule Take 1 capsule (50,000 Units total) by mouth every 7 (seven) days.   [DISCONTINUED] amLODipine (NORVASC) 10 MG tablet TAKE 1 TABLET (10 MG TOTAL) BY MOUTH DAILY   [DISCONTINUED] chlorthalidone (HYGROTON) 25 MG tablet TAKE 1 TABLET (25 MG TOTAL) BY MOUTH DAILY. DX CODE: I10   [DISCONTINUED] telmisartan (MICARDIS) 80 MG tablet TAKE 1 TABLET BY MOUTH EVERY DAY   [DISCONTINUED] valACYclovir (VALTREX) 1000 MG tablet TAKE 1 TABLET BY MOUTH TWICE A DAY   amLODipine (NORVASC) 10 MG tablet Take 1 tablet (10 mg total) by mouth daily.   chlorthalidone (HYGROTON) 25 MG tablet Take 1 tablet (25 mg total) by mouth daily. Dx Code: I10   telmisartan (MICARDIS) 80 MG tablet Take 1 tablet (80 mg total) by mouth daily.   valACYclovir (VALTREX) 1000 MG tablet Take 1 tablet (1,000 mg total) by mouth 2 (two) times daily.  No facility-administered encounter medications on file as of 12/01/2023.    Past Medical History:  Diagnosis Date   Allergy    Ankle swelling    Asthma    GERD (gastroesophageal reflux disease)    Hypertension    Primary open angle glaucoma (POAG) of right eye, severe stage     Past Surgical History:  Procedure Laterality Date   ABDOMINAL SURGERY  1998   mesh placed for scar tissue   CESAREAN SECTION CLASSICAL  1990   COLONOSCOPY  06/06/2020   TUBAL LIGATION  1990    Family History  Problem Relation Age of Onset   High blood pressure Mother    Cataracts Mother     Glaucoma Mother    High blood pressure Father    Cataracts Maternal Grandfather    Glaucoma Maternal Grandfather    Breast cancer Paternal Aunt    Colon cancer Neg Hx    Esophageal cancer Neg Hx    Rectal cancer Neg Hx    Stomach cancer Neg Hx    Inflammatory bowel disease Neg Hx    Liver disease Neg Hx    Pancreatic cancer Neg Hx     Social History   Socioeconomic History   Marital status: Single    Spouse name: Not on file   Number of children: Not on file   Years of education: Not on file   Highest education level: Not on file  Occupational History   Not on file  Tobacco Use   Smoking status: Never    Passive exposure: Never   Smokeless tobacco: Never  Vaping Use   Vaping status: Never Used  Substance and Sexual Activity   Alcohol use: Yes    Comment: occasional   Drug use: Not Currently    Types: Marijuana   Sexual activity: Not on file  Other Topics Concern   Not on file  Social History Narrative   Not on file   Social Drivers of Health   Financial Resource Strain: Low Risk  (12/01/2023)   Overall Financial Resource Strain (CARDIA)    Difficulty of Paying Living Expenses: Not hard at all  Food Insecurity: No Food Insecurity (12/01/2023)   Hunger Vital Sign    Worried About Running Out of Food in the Last Year: Never true    Ran Out of Food in the Last Year: Never true  Transportation Needs: No Transportation Needs (12/01/2023)   PRAPARE - Administrator, Civil Service (Medical): No    Lack of Transportation (Non-Medical): No  Physical Activity: Insufficiently Active (12/01/2023)   Exercise Vital Sign    Days of Exercise per Week: 1 day    Minutes of Exercise per Session: 30 min  Stress: No Stress Concern Present (12/01/2023)   [employer] of Occupational Health - Occupational Stress Questionnaire    Feeling of Stress : Only a little  Social Connections: Moderately Isolated (12/01/2023)   Social Connection and Isolation Panel [NHANES]     Frequency of Communication with Friends and Family: More than three times a week    Frequency of Social Gatherings with Friends and Family: Once a week    Attends Religious Services: More than 4 times per year    Active Member of [employer] or Organizations: No    Attends Banker Meetings: Never    Marital Status: Never married  Intimate Partner Violence: Not At Risk (12/01/2023)   Humiliation, Afraid, Rape, and Kick questionnaire    Fear of Current  or Ex-Partner: No    Emotionally Abused: No    Physically Abused: No    Sexually Abused: No    Review of Systems  All other systems reviewed and are negative.       Objective    BP (!) 149/80 (BP Location: Right Arm, Patient Position: Sitting, Cuff Size: Normal)   Pulse 72   Temp 98.1 F (36.7 C) (Oral)   Resp 16   Ht 5\' 9"  (1.753 m)   Wt 238 lb 3.2 oz (108 kg)   LMP 02/27/2016   SpO2 95%   BMI 35.18 kg/m   Physical Exam Vitals and nursing note reviewed.  Constitutional:      General: She is not in acute distress.    Appearance: She is obese.  Cardiovascular:     Rate and Rhythm: Normal rate and regular rhythm.  Pulmonary:     Effort: Pulmonary effort is normal.     Breath sounds: Normal breath sounds.  Abdominal:     Palpations: Abdomen is soft.     Tenderness: There is no abdominal tenderness.  Neurological:     General: No focal deficit present.     Mental Status: She is alert and oriented to person, place, and time.         Assessment & Plan:   Vitamin D deficiency -     VITAMIN D 25 Hydroxy (Vit-D Deficiency, Fractures)  Herpes zoster without complication  Essential hypertension -     Telmisartan; Take 1 tablet (80 mg total) by mouth daily.  Dispense: 90 tablet; Refill: 1  Rash and nonspecific skin eruption -     valACYclovir HCl; Take 1 tablet (1,000 mg total) by mouth 2 (two) times daily.  Dispense: 180 tablet; Refill: 1  Other orders -     Chlorthalidone; Take 1 tablet (25 mg  total) by mouth daily. Dx Code: I10  Dispense: 90 tablet; Refill: 1 -     amLODIPine Besylate; Take 1 tablet (10 mg total) by mouth daily.  Dispense: 90 tablet; Refill: 1     No follow-ups on file.   Tommie Raymond, MD

## 2023-12-06 ENCOUNTER — Encounter: Payer: Self-pay | Admitting: Family Medicine

## 2023-12-06 NOTE — Progress Notes (Signed)
I called and spoke with patient and made him aware of lab results Vitamin D within range - continue with daily supplements of 1000-2000 units

## 2023-12-06 NOTE — Progress Notes (Signed)
I called and spoke with patient about her lab results Vitamin D within range - continue with daily supplements of 1000-2000 units

## 2024-02-29 ENCOUNTER — Encounter: Payer: Self-pay | Admitting: Family Medicine

## 2024-02-29 ENCOUNTER — Ambulatory Visit: Payer: No Typology Code available for payment source | Admitting: Family Medicine

## 2024-02-29 VITALS — BP 132/86 | HR 81 | Temp 97.8°F | Resp 16 | Ht 69.0 in | Wt 234.4 lb

## 2024-02-29 DIAGNOSIS — Z6834 Body mass index (BMI) 34.0-34.9, adult: Secondary | ICD-10-CM

## 2024-02-29 DIAGNOSIS — E66811 Obesity, class 1: Secondary | ICD-10-CM

## 2024-02-29 DIAGNOSIS — I10 Essential (primary) hypertension: Secondary | ICD-10-CM | POA: Diagnosis not present

## 2024-02-29 DIAGNOSIS — M5430 Sciatica, unspecified side: Secondary | ICD-10-CM | POA: Diagnosis not present

## 2024-02-29 DIAGNOSIS — E6609 Other obesity due to excess calories: Secondary | ICD-10-CM

## 2024-02-29 MED ORDER — TRIAMCINOLONE ACETONIDE 40 MG/ML IJ SUSP
40.0000 mg | Freq: Once | INTRAMUSCULAR | Status: AC
Start: 2024-02-29 — End: 2024-02-29
  Administered 2024-02-29: 40 mg via INTRAMUSCULAR

## 2024-02-29 NOTE — Progress Notes (Unsigned)
Patient given triamcinolone Acetonide 40 mg

## 2024-03-01 ENCOUNTER — Encounter: Payer: Self-pay | Admitting: Family Medicine

## 2024-03-01 NOTE — Progress Notes (Signed)
 Established Patient Office Visit  Subjective    Patient ID: Amy Combs, female    DOB: 02-Dec-1961  Age: 63 y.o. MRN: [id number]  CC:  Chief Complaint  Patient presents with   Follow-up    3 month     HPI Amy Combs presents for routine follow up of hypertension. Patient also reports that her sciatica persists.   Outpatient Encounter Medications as of 02/29/2024  Medication Sig   albuterol (PROVENTIL) (2.5 MG/3ML) 0.083% nebulizer solution    albuterol (VENTOLIN HFA) 108 (90 Base) MCG/ACT inhaler Inhale 2 puffs into the lungs every 4 (four) hours as needed for wheezing or shortness of breath.   Albuterol-Budesonide (AIRSUPRA) 90-80 MCG/ACT AERO Inhale 2 puffs into the lungs every 4 (four) hours as needed.   amLODipine (NORVASC) 10 MG tablet Take 1 tablet (10 mg total) by mouth daily.   beclomethasone (QVAR REDIHALER) 80 MCG/ACT inhaler During asthma flares take in addition to Symbicort inhaler 3 inhalations 3 times a day   Bepotastine Besilate 1.5 % SOLN Place 1 drop into both eyes 2 (two) times daily as needed.   BREZTRI AEROSPHERE 160-9-4.8 MCG/ACT AERO Inhale 2 puffs into the lungs 2 (two) times daily. 2 inhalations 2(TWO) Times a day.   brimonidine (ALPHAGAN) 0.2 % ophthalmic solution Place 1 drop into the right eye 2 (two) times daily.   cetirizine (ZYRTEC) 10 MG tablet Take 1 tablet (10 mg total) by mouth daily as needed for allergies (Can take an extra dose during flare ups.).   chlorthalidone (HYGROTON) 25 MG tablet Take 1 tablet (25 mg total) by mouth daily. Dx Code: I10   cyclobenzaprine (FLEXERIL) 5 MG tablet Take 1 tablet (5 mg total) by mouth 3 (three) times daily as needed for muscle spasms.   dorzolamide (TRUSOPT) 2 % ophthalmic solution Place 1 drop into the right eye 3 (three) times daily.   famotidine (PEPCID) 40 MG tablet Take 1 tablet (40 mg total) by mouth at bedtime.   levalbuterol (XOPENEX) 1.25 MG/3ML nebulizer solution Take 1.25 mg by nebulization  every 4 (four) hours as needed for wheezing.   meloxicam (MOBIC) 15 MG tablet Take 1 tablet (15 mg total) by mouth daily.   montelukast (SINGULAIR) 10 MG tablet Take 1 tablet (10 mg total) by mouth at bedtime.   Omega 3-6-9 Fatty Acids (OMEGA 3-6-9 COMPLEX PO) Take 1 capsule by mouth in the morning. Krill Omega 50+ with CoQ10   pantoprazole (PROTONIX) 40 MG tablet Take 1 tablet (40 mg total) by mouth in the morning.   PARoxetine (PAXIL) 10 MG tablet TAKE 1 TABLET BY MOUTH EVERY DAY   telmisartan (MICARDIS) 80 MG tablet Take 1 tablet (80 mg total) by mouth daily.   valACYclovir (VALTREX) 1000 MG tablet Take 1 tablet (1,000 mg total) by mouth 2 (two) times daily.   vitamin B-12 (CYANOCOBALAMIN) 1000 MCG tablet Take 1,000 mcg by mouth in the morning.   Vitamin D, Ergocalciferol, (DRISDOL) 1.25 MG (50000 UNIT) CAPS capsule Take 1 capsule (50,000 Units total) by mouth every 7 (seven) days.   [EXPIRED] triamcinolone acetonide (KENALOG-40) injection 40 mg    No facility-administered encounter medications on file as of 02/29/2024.    Past Medical History:  Diagnosis Date   Allergy    Ankle swelling    Asthma    GERD (gastroesophageal reflux disease)    Hypertension    Primary open angle glaucoma (POAG) of right eye, severe stage     Past Surgical History:  Procedure Laterality  Date   ABDOMINAL SURGERY  1998   mesh placed for scar tissue   CESAREAN SECTION CLASSICAL  1990   COLONOSCOPY  06/06/2020   TUBAL LIGATION  1990    Family History  Problem Relation Age of Onset   High blood pressure Mother    Cataracts Mother    Glaucoma Mother    High blood pressure Father    Cataracts Maternal Grandfather    Glaucoma Maternal Grandfather    Breast cancer Paternal Aunt    Colon cancer Neg Hx    Esophageal cancer Neg Hx    Rectal cancer Neg Hx    Stomach cancer Neg Hx    Inflammatory bowel disease Neg Hx    Liver disease Neg Hx    Pancreatic cancer Neg Hx     Social History    Socioeconomic History   Marital status: Single    Spouse name: Not on file   Number of children: Not on file   Years of education: Not on file   Highest education level: Not on file  Occupational History   Not on file  Tobacco Use   Smoking status: Never    Passive exposure: Never   Smokeless tobacco: Never  Vaping Use   Vaping status: Never Used  Substance and Sexual Activity   Alcohol use: Yes    Comment: occasional   Drug use: Not Currently    Types: Marijuana   Sexual activity: Not on file  Other Topics Concern   Not on file  Social History Narrative   Not on file   Social Drivers of Health   Financial Resource Strain: Low Risk  (12/01/2023)   Overall Financial Resource Strain (CARDIA)    Difficulty of Paying Living Expenses: Not hard at all  Food Insecurity: No Food Insecurity (12/01/2023)   Hunger Vital Sign    Worried About Running Out of Food in the Last Year: Never true    Ran Out of Food in the Last Year: Never true  Transportation Needs: No Transportation Needs (12/01/2023)   PRAPARE - Administrator, Civil Service (Medical): No    Lack of Transportation (Non-Medical): No  Physical Activity: Insufficiently Active (12/01/2023)   Exercise Vital Sign    Days of Exercise per Week: 1 day    Minutes of Exercise per Session: 30 min  Stress: No Stress Concern Present (12/01/2023)   [employer] of Occupational Health - Occupational Stress Questionnaire    Feeling of Stress : Only a little  Social Connections: Moderately Isolated (12/01/2023)   Social Connection and Isolation Panel [NHANES]    Frequency of Communication with Friends and Family: More than three times a week    Frequency of Social Gatherings with Friends and Family: Once a week    Attends Religious Services: More than 4 times per year    Active Member of [employer] or Organizations: No    Attends Banker Meetings: Never    Marital Status: Never married  Intimate Partner  Violence: Not At Risk (12/01/2023)   Humiliation, Afraid, Rape, and Kick questionnaire    Fear of Current or Ex-Partner: No    Emotionally Abused: No    Physically Abused: No    Sexually Abused: No    Review of Systems  Musculoskeletal:  Positive for back pain.  All other systems reviewed and are negative.       Objective    BP 132/86   Pulse 81   Temp 97.8 F (36.6  C) (Oral)   Resp 16   Ht 5\' 9"  (1.753 m)   Wt 234 lb 6.4 oz (106.3 kg)   LMP 02/27/2016   SpO2 93%   BMI 34.61 kg/m   Physical Exam Vitals and nursing note reviewed.  Constitutional:      General: She is not in acute distress.    Appearance: She is obese.  Cardiovascular:     Rate and Rhythm: Normal rate and regular rhythm.  Pulmonary:     Effort: Pulmonary effort is normal.     Breath sounds: Normal breath sounds.  Abdominal:     Palpations: Abdomen is soft.     Tenderness: There is no abdominal tenderness.  Neurological:     General: No focal deficit present.     Mental Status: She is alert and oriented to person, place, and time.         Assessment & Plan:   Essential hypertension  Class 1 obesity due to excess calories with serious comorbidity and body mass index (BMI) of 34.0 to 34.9 in adult  Sciatica, unspecified laterality -     Triamcinolone Acetonide     Return in about 3 months (around 05/31/2024) for follow up, chronic med issues.   Tommie Raymond, MD

## 2024-05-03 ENCOUNTER — Other Ambulatory Visit: Payer: Self-pay | Admitting: Family Medicine

## 2024-05-03 DIAGNOSIS — R21 Rash and other nonspecific skin eruption: Secondary | ICD-10-CM

## 2024-05-04 NOTE — Telephone Encounter (Signed)
 Requested Prescriptions  Pending Prescriptions Disp Refills   valACYclovir  (VALTREX ) 1000 MG tablet [Pharmacy Med Name: VALACYCLOVIR  HCL 1 GRAM TABLET] 180 tablet 0    Sig: TAKE 1 TABLET BY MOUTH TWICE A DAY     Antimicrobials:  Antiviral Agents - Anti-Herpetic Passed - 05/04/2024  1:41 PM      Passed - Valid encounter within last 12 months    Recent Outpatient Visits           2 months ago Essential hypertension   [place] [facility], MD   5 months ago Essential hypertension   [place] [facility], MD   8 months ago Vitamin D  deficiency   [facility], MD   1 year ago Annual physical exam   [place] [facility], MD   1 year ago Essential hypertension   [place] [facility], MD       Future Appointments             In 3 weeks Abraham Abo, MD [facility]

## 2024-05-31 ENCOUNTER — Encounter: Payer: Self-pay | Admitting: Family Medicine

## 2024-05-31 ENCOUNTER — Ambulatory Visit (INDEPENDENT_AMBULATORY_CARE_PROVIDER_SITE_OTHER): Admitting: Family Medicine

## 2024-05-31 VITALS — BP 130/82 | HR 78 | Wt 231.0 lb

## 2024-05-31 DIAGNOSIS — E66811 Obesity, class 1: Secondary | ICD-10-CM | POA: Diagnosis not present

## 2024-05-31 DIAGNOSIS — E6609 Other obesity due to excess calories: Secondary | ICD-10-CM | POA: Diagnosis not present

## 2024-05-31 DIAGNOSIS — I10 Essential (primary) hypertension: Secondary | ICD-10-CM | POA: Diagnosis not present

## 2024-05-31 DIAGNOSIS — Z6834 Body mass index (BMI) 34.0-34.9, adult: Secondary | ICD-10-CM

## 2024-05-31 DIAGNOSIS — M5430 Sciatica, unspecified side: Secondary | ICD-10-CM | POA: Diagnosis not present

## 2024-05-31 DIAGNOSIS — F411 Generalized anxiety disorder: Secondary | ICD-10-CM

## 2024-05-31 DIAGNOSIS — E785 Hyperlipidemia, unspecified: Secondary | ICD-10-CM

## 2024-05-31 MED ORDER — PAROXETINE HCL 20 MG PO TABS: 20.0000 mg | ORAL_TABLET | Freq: Every day | ORAL | 0 refills | Status: DC

## 2024-05-31 MED ORDER — TRIAMCINOLONE ACETONIDE 40 MG/ML IJ SUSP
40.0000 mg | Freq: Once | INTRAMUSCULAR | Status: AC
Start: 2024-05-31 — End: 2024-05-31
  Administered 2024-05-31: 40 mg via INTRAMUSCULAR

## 2024-05-31 NOTE — Progress Notes (Signed)
 Established Patient Office Visit  Subjective    Patient ID: Amy Combs, female    DOB: Jan 26, 1961  Age: 63 y.o. MRN: [id number]  CC:  Chief Complaint  Patient presents with   Medical Management of Chronic Issues    HPI Amy Combs presents for routine follow up of chronic med issues including hypertension. Patient also reports increased social stressors that may be causing anxiety. She also has increase anxiety with driving. Patient restarted her paxil .   Outpatient Encounter Medications as of 05/31/2024  Medication Sig   albuterol  (PROVENTIL ) (2.5 MG/3ML) 0.083% nebulizer solution    albuterol  (VENTOLIN  HFA) 108 (90 Base) MCG/ACT inhaler Inhale 2 puffs into the lungs every 4 (four) hours as needed for wheezing or shortness of breath.   Albuterol -Budesonide  (AIRSUPRA ) 90-80 MCG/ACT AERO Inhale 2 puffs into the lungs every 4 (four) hours as needed.   amLODipine  (NORVASC ) 10 MG tablet TAKE 1 TABLET BY MOUTH EVERY DAY   beclomethasone (QVAR  REDIHALER) 80 MCG/ACT inhaler During asthma flares take in addition to Symbicort  inhaler 3 inhalations 3 times a day   Bepotastine  Besilate 1.5 % SOLN Place 1 drop into both eyes 2 (two) times daily as needed.   BREZTRI  AEROSPHERE 160-9-4.8 MCG/ACT AERO Inhale 2 puffs into the lungs 2 (two) times daily. 2 inhalations 2(TWO) Times a day.   brimonidine (ALPHAGAN) 0.2 % ophthalmic solution Place 1 drop into the right eye 2 (two) times daily.   cetirizine  (ZYRTEC ) 10 MG tablet Take 1 tablet (10 mg total) by mouth daily as needed for allergies (Can take an extra dose during flare ups.).   chlorthalidone  (HYGROTON ) 25 MG tablet Take 1 tablet (25 mg total) by mouth daily. Dx Code: I10   cyclobenzaprine  (FLEXERIL ) 5 MG tablet Take 1 tablet (5 mg total) by mouth 3 (three) times daily as needed for muscle spasms.   dorzolamide (TRUSOPT) 2 % ophthalmic solution Place 1 drop into the right eye 3 (three) times daily.   famotidine  (PEPCID ) 40 MG tablet  Take 1 tablet (40 mg total) by mouth at bedtime.   levalbuterol (XOPENEX) 1.25 MG/3ML nebulizer solution Take 1.25 mg by nebulization every 4 (four) hours as needed for wheezing.   meloxicam  (MOBIC ) 15 MG tablet Take 1 tablet (15 mg total) by mouth daily.   montelukast  (SINGULAIR ) 10 MG tablet Take 1 tablet (10 mg total) by mouth at bedtime.   Omega 3-6-9 Fatty Acids (OMEGA 3-6-9 COMPLEX PO) Take 1 capsule by mouth in the morning. Krill Omega 50+ with CoQ10   pantoprazole  (PROTONIX ) 40 MG tablet Take 1 tablet (40 mg total) by mouth in the morning.   PARoxetine  (PAXIL ) 10 MG tablet TAKE 1 TABLET BY MOUTH EVERY DAY   PARoxetine  (PAXIL ) 20 MG tablet Take 1 tablet (20 mg total) by mouth daily.   telmisartan  (MICARDIS ) 80 MG tablet Take 1 tablet (80 mg total) by mouth daily.   valACYclovir  (VALTREX ) 1000 MG tablet TAKE 1 TABLET BY MOUTH TWICE A DAY   vitamin B-12 (CYANOCOBALAMIN) 1000 MCG tablet Take 1,000 mcg by mouth in the morning.   Vitamin D , Ergocalciferol , (DRISDOL ) 1.25 MG (50000 UNIT) CAPS capsule Take 1 capsule (50,000 Units total) by mouth every 7 (seven) days.   [EXPIRED] triamcinolone  acetonide (KENALOG -40) injection 40 mg    No facility-administered encounter medications on file as of 05/31/2024.    Past Medical History:  Diagnosis Date   Allergy    Ankle swelling    Asthma    GERD (gastroesophageal reflux disease)  Hypertension    Primary open angle glaucoma (POAG) of right eye, severe stage     Past Surgical History:  Procedure Laterality Date   ABDOMINAL SURGERY  1998   mesh placed for scar tissue   CESAREAN SECTION CLASSICAL  1990   COLONOSCOPY  06/06/2020   TUBAL LIGATION  1990    Family History  Problem Relation Age of Onset   High blood pressure Mother    Cataracts Mother    Glaucoma Mother    High blood pressure Father    Cataracts Maternal Grandfather    Glaucoma Maternal Grandfather    Breast cancer Paternal Aunt    Colon cancer Neg Hx    Esophageal  cancer Neg Hx    Rectal cancer Neg Hx    Stomach cancer Neg Hx    Inflammatory bowel disease Neg Hx    Liver disease Neg Hx    Pancreatic cancer Neg Hx     Social History   Socioeconomic History   Marital status: Single    Spouse name: Not on file   Number of children: Not on file   Years of education: Not on file   Highest education level: Not on file  Occupational History   Not on file  Tobacco Use   Smoking status: Never    Passive exposure: Never   Smokeless tobacco: Never  Vaping Use   Vaping status: Never Used  Substance and Sexual Activity   Alcohol use: Yes    Comment: occasional   Drug use: Not Currently    Types: Marijuana   Sexual activity: Not on file  Other Topics Concern   Not on file  Social History Narrative   Not on file   Social Drivers of Health   Financial Resource Strain: Low Risk  (12/01/2023)   Overall Financial Resource Strain (CARDIA)    Difficulty of Paying Living Expenses: Not hard at all  Food Insecurity: No Food Insecurity (12/01/2023)   Hunger Vital Sign    Worried About Running Out of Food in the Last Year: Never true    Ran Out of Food in the Last Year: Never true  Transportation Needs: No Transportation Needs (12/01/2023)   PRAPARE - Administrator, Civil Service (Medical): No    Lack of Transportation (Non-Medical): No  Physical Activity: Insufficiently Active (12/01/2023)   Exercise Vital Sign    Days of Exercise per Week: 1 day    Minutes of Exercise per Session: 30 min  Stress: No Stress Concern Present (12/01/2023)   [employer] of Occupational Health - Occupational Stress Questionnaire    Feeling of Stress : Only a little  Social Connections: Moderately Isolated (12/01/2023)   Social Connection and Isolation Panel    Frequency of Communication with Friends and Family: More than three times a week    Frequency of Social Gatherings with Friends and Family: Once a week    Attends Religious Services: More  than 4 times per year    Active Member of [employer] or Organizations: No    Attends Banker Meetings: Never    Marital Status: Never married  Intimate Partner Violence: Not At Risk (12/01/2023)   Humiliation, Afraid, Rape, and Kick questionnaire    Fear of Current or Ex-Partner: No    Emotionally Abused: No    Physically Abused: No    Sexually Abused: No    Review of Systems  Psychiatric/Behavioral:  The patient is nervous/anxious.   All other systems reviewed and  are negative.       Objective    BP 130/82 (BP Location: Right Arm, Patient Position: Sitting)   Pulse 78   Wt 231 lb (104.8 kg)   LMP 02/27/2016   SpO2 92%   BMI 34.11 kg/m   Physical Exam Vitals and nursing note reviewed.  Constitutional:      General: She is not in acute distress.    Appearance: She is obese.   Cardiovascular:     Rate and Rhythm: Normal rate and regular rhythm.  Pulmonary:     Effort: Pulmonary effort is normal.     Breath sounds: Normal breath sounds.  Abdominal:     Palpations: Abdomen is soft.     Tenderness: There is no abdominal tenderness.   Neurological:     General: No focal deficit present.     Mental Status: She is alert and oriented to person, place, and time.   Psychiatric:        Mood and Affect: Affect normal. Mood is anxious.         Assessment & Plan:  1. Essential hypertension (Primary) Appears stable. Continue   2. Class 1 obesity due to excess calories with serious comorbidity and body mass index (BMI) of 34.0 to 34.9 in adult Discussed activity and dietary options  3. Hyperlipidemia, unspecified hyperlipidemia type Continue   4. Anxiety state Patient will restart paxil  10 mg daily  5. Sciatica, unspecified laterality Kenalog  IM given - triamcinolone  acetonide (KENALOG -40) injection 40 mg    Return in about 6 weeks (around 07/12/2024) for follow up.   Arlo Lama, MD

## 2024-06-01 ENCOUNTER — Encounter: Payer: Self-pay | Admitting: Family Medicine

## 2024-07-12 ENCOUNTER — Ambulatory Visit (INDEPENDENT_AMBULATORY_CARE_PROVIDER_SITE_OTHER): Admitting: Family Medicine

## 2024-07-12 ENCOUNTER — Encounter: Payer: Self-pay | Admitting: Family Medicine

## 2024-07-12 VITALS — BP 126/86 | HR 76 | Ht 69.0 in | Wt 228.4 lb

## 2024-07-12 DIAGNOSIS — F32A Depression, unspecified: Secondary | ICD-10-CM | POA: Diagnosis not present

## 2024-07-12 DIAGNOSIS — L989 Disorder of the skin and subcutaneous tissue, unspecified: Secondary | ICD-10-CM | POA: Diagnosis not present

## 2024-07-12 DIAGNOSIS — F419 Anxiety disorder, unspecified: Secondary | ICD-10-CM

## 2024-07-12 MED ORDER — MELOXICAM 15 MG PO TABS: 15.0000 mg | ORAL_TABLET | Freq: Every day | ORAL | 2 refills | Status: AC

## 2024-07-12 NOTE — Progress Notes (Signed)
 Established Patient Office Visit  Subjective    Patient ID: Amy Combs, female    DOB: March 31, 1961  Age: 63 y.o. MRN: [id number]  CC:  Chief Complaint  Patient presents with   Medical Management of Chronic Issues    Pt has spot on back she wants Dr Tanda to look at     HPI [facility] presents for complaint of  skin lesions on her back that she would like evaluated.   Outpatient Encounter Medications as of 07/12/2024  Medication Sig   albuterol  (PROVENTIL ) (2.5 MG/3ML) 0.083% nebulizer solution    albuterol  (VENTOLIN  HFA) 108 (90 Base) MCG/ACT inhaler Inhale 2 puffs into the lungs every 4 (four) hours as needed for wheezing or shortness of breath.   Albuterol -Budesonide  (AIRSUPRA ) 90-80 MCG/ACT AERO Inhale 2 puffs into the lungs every 4 (four) hours as needed.   amLODipine  (NORVASC ) 10 MG tablet TAKE 1 TABLET BY MOUTH EVERY DAY   beclomethasone (QVAR  REDIHALER) 80 MCG/ACT inhaler During asthma flares take in addition to Symbicort  inhaler 3 inhalations 3 times a day   Bepotastine  Besilate 1.5 % SOLN Place 1 drop into both eyes 2 (two) times daily as needed.   BREZTRI  AEROSPHERE 160-9-4.8 MCG/ACT AERO Inhale 2 puffs into the lungs 2 (two) times daily. 2 inhalations 2(TWO) Times a day.   brimonidine (ALPHAGAN) 0.2 % ophthalmic solution Place 1 drop into the right eye 2 (two) times daily.   cetirizine  (ZYRTEC ) 10 MG tablet Take 1 tablet (10 mg total) by mouth daily as needed for allergies (Can take an extra dose during flare ups.).   chlorthalidone  (HYGROTON ) 25 MG tablet Take 1 tablet (25 mg total) by mouth daily. Dx Code: I10   cyclobenzaprine  (FLEXERIL ) 5 MG tablet Take 1 tablet (5 mg total) by mouth 3 (three) times daily as needed for muscle spasms.   dorzolamide (TRUSOPT) 2 % ophthalmic solution Place 1 drop into the right eye 3 (three) times daily.   famotidine  (PEPCID ) 40 MG tablet Take 1 tablet (40 mg total) by mouth at bedtime.   levalbuterol (XOPENEX) 1.25 MG/3ML  nebulizer solution Take 1.25 mg by nebulization every 4 (four) hours as needed for wheezing.   montelukast  (SINGULAIR ) 10 MG tablet Take 1 tablet (10 mg total) by mouth at bedtime.   Omega 3-6-9 Fatty Acids (OMEGA 3-6-9 COMPLEX PO) Take 1 capsule by mouth in the morning. Krill Omega 50+ with CoQ10   pantoprazole  (PROTONIX ) 40 MG tablet Take 1 tablet (40 mg total) by mouth in the morning.   PARoxetine  (PAXIL ) 10 MG tablet TAKE 1 TABLET BY MOUTH EVERY DAY   PARoxetine  (PAXIL ) 20 MG tablet Take 1 tablet (20 mg total) by mouth daily.   telmisartan  (MICARDIS ) 80 MG tablet Take 1 tablet (80 mg total) by mouth daily.   valACYclovir  (VALTREX ) 1000 MG tablet TAKE 1 TABLET BY MOUTH TWICE A DAY   vitamin B-12 (CYANOCOBALAMIN) 1000 MCG tablet Take 1,000 mcg by mouth in the morning.   Vitamin D , Ergocalciferol , (DRISDOL ) 1.25 MG (50000 UNIT) CAPS capsule Take 1 capsule (50,000 Units total) by mouth every 7 (seven) days.   [DISCONTINUED] meloxicam  (MOBIC ) 15 MG tablet Take 1 tablet (15 mg total) by mouth daily.   meloxicam  (MOBIC ) 15 MG tablet Take 1 tablet (15 mg total) by mouth daily.   No facility-administered encounter medications on file as of 07/12/2024.    Past Medical History:  Diagnosis Date   Allergy    Ankle swelling    Asthma  GERD (gastroesophageal reflux disease)    Hypertension    Primary open angle glaucoma (POAG) of right eye, severe stage     Past Surgical History:  Procedure Laterality Date   ABDOMINAL SURGERY  1998   mesh placed for scar tissue   CESAREAN SECTION CLASSICAL  1990   COLONOSCOPY  06/06/2020   TUBAL LIGATION  1990    Family History  Problem Relation Age of Onset   High blood pressure Mother    Cataracts Mother    Glaucoma Mother    High blood pressure Father    Cataracts Maternal Grandfather    Glaucoma Maternal Grandfather    Breast cancer Paternal Aunt    Colon cancer Neg Hx    Esophageal cancer Neg Hx    Rectal cancer Neg Hx    Stomach cancer Neg  Hx    Inflammatory bowel disease Neg Hx    Liver disease Neg Hx    Pancreatic cancer Neg Hx     Social History   Socioeconomic History   Marital status: Single    Spouse name: Not on file   Number of children: Not on file   Years of education: Not on file   Highest education level: Not on file  Occupational History   Not on file  Tobacco Use   Smoking status: Never    Passive exposure: Never   Smokeless tobacco: Never  Vaping Use   Vaping status: Never Used  Substance and Sexual Activity   Alcohol use: Yes    Comment: occasional   Drug use: Not Currently    Types: Marijuana   Sexual activity: Not on file  Other Topics Concern   Not on file  Social History Narrative   Not on file   Social Drivers of Health   Financial Resource Strain: Low Risk  (12/01/2023)   Overall Financial Resource Strain (CARDIA)    Difficulty of Paying Living Expenses: Not hard at all  Food Insecurity: No Food Insecurity (12/01/2023)   Hunger Vital Sign    Worried About Running Out of Food in the Last Year: Never true    Ran Out of Food in the Last Year: Never true  Transportation Needs: No Transportation Needs (12/01/2023)   PRAPARE - Administrator, Civil Service (Medical): No    Lack of Transportation (Non-Medical): No  Physical Activity: Insufficiently Active (12/01/2023)   Exercise Vital Sign    Days of Exercise per Week: 1 day    Minutes of Exercise per Session: 30 min  Stress: No Stress Concern Present (12/01/2023)   [employer] of Occupational Health - Occupational Stress Questionnaire    Feeling of Stress : Only a little  Social Connections: Moderately Isolated (12/01/2023)   Social Connection and Isolation Panel    Frequency of Communication with Friends and Family: More than three times a week    Frequency of Social Gatherings with Friends and Family: Once a week    Attends Religious Services: More than 4 times per year    Active Member of [employer] or  Organizations: No    Attends Banker Meetings: Never    Marital Status: Never married  Intimate Partner Violence: Not At Risk (12/01/2023)   Humiliation, Afraid, Rape, and Kick questionnaire    Fear of Current or Ex-Partner: No    Emotionally Abused: No    Physically Abused: No    Sexually Abused: No    Review of Systems  All other systems reviewed and are  negative.       Objective    BP 126/86   Pulse 76   Ht 5' 9 (1.753 m)   Wt 228 lb 6.4 oz (103.6 kg)   LMP 02/27/2016   SpO2 92%   BMI 33.73 kg/m   Physical Exam Vitals and nursing note reviewed.  Constitutional:      General: She is not in acute distress.    Appearance: She is obese.  Cardiovascular:     Rate and Rhythm: Normal rate and regular rhythm.  Pulmonary:     Effort: Pulmonary effort is normal.     Breath sounds: Normal breath sounds.  Abdominal:     Palpations: Abdomen is soft.     Tenderness: There is no abdominal tenderness.  Skin:    Findings: Lesion (back) present.  Neurological:     General: No focal deficit present.     Mental Status: She is alert and oriented to person, place, and time.  Psychiatric:        Mood and Affect: Affect normal. Mood is anxious.         Assessment & Plan:   1. Skin lesion of back (Primary)  - Ambulatory referral to Dermatology  2. Anxiety and depression Improving. Continue      Return in about 6 weeks (around 08/23/2024) for follow up.   Tanda Raguel SQUIBB, MD

## 2024-07-16 ENCOUNTER — Encounter: Payer: Self-pay | Admitting: Family Medicine

## 2024-08-11 ENCOUNTER — Other Ambulatory Visit: Payer: Self-pay | Admitting: Family Medicine

## 2024-08-20 ENCOUNTER — Other Ambulatory Visit: Payer: Self-pay | Admitting: Family Medicine

## 2024-08-20 DIAGNOSIS — R21 Rash and other nonspecific skin eruption: Secondary | ICD-10-CM

## 2024-08-23 ENCOUNTER — Ambulatory Visit: Admitting: Family Medicine

## 2024-08-26 ENCOUNTER — Other Ambulatory Visit: Payer: Self-pay | Admitting: Family Medicine

## 2024-09-12 ENCOUNTER — Other Ambulatory Visit: Payer: Self-pay | Admitting: Family Medicine

## 2024-09-12 DIAGNOSIS — Z1231 Encounter for screening mammogram for malignant neoplasm of breast: Secondary | ICD-10-CM

## 2024-09-24 ENCOUNTER — Encounter: Payer: Self-pay | Admitting: Family Medicine

## 2024-09-24 ENCOUNTER — Ambulatory Visit (INDEPENDENT_AMBULATORY_CARE_PROVIDER_SITE_OTHER): Admitting: Family Medicine

## 2024-09-24 VITALS — BP 129/80 | HR 83 | Temp 98.3°F | Resp 16 | Wt 228.0 lb

## 2024-09-24 DIAGNOSIS — E6609 Other obesity due to excess calories: Secondary | ICD-10-CM

## 2024-09-24 DIAGNOSIS — Z6834 Body mass index (BMI) 34.0-34.9, adult: Secondary | ICD-10-CM | POA: Diagnosis not present

## 2024-09-24 DIAGNOSIS — I10 Essential (primary) hypertension: Secondary | ICD-10-CM

## 2024-09-24 DIAGNOSIS — E66811 Obesity, class 1: Secondary | ICD-10-CM

## 2024-09-24 NOTE — Progress Notes (Signed)
 Established Patient Office Visit  Subjective    Patient ID: Amy Combs, female    DOB: 04/27/1961  Age: 63 y.o. MRN: [id number]  CC:  Chief Complaint  Patient presents with   Medical Management of Chronic Issues    HPI Amy Combs presents for follow up of hypertension. Patient reports med compliance and denies acute complaints.   Outpatient Encounter Medications as of 09/24/2024  Medication Sig   albuterol  (PROVENTIL ) (2.5 MG/3ML) 0.083% nebulizer solution    albuterol  (VENTOLIN  HFA) 108 (90 Base) MCG/ACT inhaler Inhale 2 puffs into the lungs every 4 (four) hours as needed for wheezing or shortness of breath.   Albuterol -Budesonide  (AIRSUPRA ) 90-80 MCG/ACT AERO Inhale 2 puffs into the lungs every 4 (four) hours as needed.   amLODipine  (NORVASC ) 10 MG tablet TAKE 1 TABLET BY MOUTH EVERY DAY   beclomethasone (QVAR  REDIHALER) 80 MCG/ACT inhaler During asthma flares take in addition to Symbicort  inhaler 3 inhalations 3 times a day   Bepotastine  Besilate 1.5 % SOLN Place 1 drop into both eyes 2 (two) times daily as needed.   BREZTRI  AEROSPHERE 160-9-4.8 MCG/ACT AERO Inhale 2 puffs into the lungs 2 (two) times daily. 2 inhalations 2(TWO) Times a day.   brimonidine (ALPHAGAN) 0.2 % ophthalmic solution Place 1 drop into the right eye 2 (two) times daily.   cetirizine  (ZYRTEC ) 10 MG tablet Take 1 tablet (10 mg total) by mouth daily as needed for allergies (Can take an extra dose during flare ups.).   chlorthalidone  (HYGROTON ) 25 MG tablet TAKE 1 TABLET (25 MG TOTAL) BY MOUTH DAILY. DX CODE: I10   cyclobenzaprine  (FLEXERIL ) 5 MG tablet Take 1 tablet (5 mg total) by mouth 3 (three) times daily as needed for muscle spasms.   dorzolamide (TRUSOPT) 2 % ophthalmic solution Place 1 drop into the right eye 3 (three) times daily.   famotidine  (PEPCID ) 40 MG tablet Take 1 tablet (40 mg total) by mouth at bedtime.   levalbuterol (XOPENEX) 1.25 MG/3ML nebulizer solution Take 1.25 mg by  nebulization every 4 (four) hours as needed for wheezing.   meloxicam  (MOBIC ) 15 MG tablet Take 1 tablet (15 mg total) by mouth daily.   montelukast  (SINGULAIR ) 10 MG tablet Take 1 tablet (10 mg total) by mouth at bedtime.   Omega 3-6-9 Fatty Acids (OMEGA 3-6-9 COMPLEX PO) Take 1 capsule by mouth in the morning. Krill Omega 50+ with CoQ10   pantoprazole  (PROTONIX ) 40 MG tablet Take 1 tablet (40 mg total) by mouth in the morning.   PARoxetine  (PAXIL ) 10 MG tablet TAKE 1 TABLET BY MOUTH EVERY DAY   PARoxetine  (PAXIL ) 20 MG tablet TAKE 1 TABLET BY MOUTH EVERY DAY   telmisartan  (MICARDIS ) 80 MG tablet Take 1 tablet (80 mg total) by mouth daily.   valACYclovir  (VALTREX ) 1000 MG tablet TAKE 1 TABLET BY MOUTH TWICE A DAY   vitamin B-12 (CYANOCOBALAMIN) 1000 MCG tablet Take 1,000 mcg by mouth in the morning.   Vitamin D , Ergocalciferol , (DRISDOL ) 1.25 MG (50000 UNIT) CAPS capsule Take 1 capsule (50,000 Units total) by mouth every 7 (seven) days.   No facility-administered encounter medications on file as of 09/24/2024.    Past Medical History:  Diagnosis Date   Allergy    Ankle swelling    Asthma    GERD (gastroesophageal reflux disease)    Hypertension    Primary open angle glaucoma (POAG) of right eye, severe stage     Past Surgical History:  Procedure Laterality Date   ABDOMINAL  SURGERY  1998   mesh placed for scar tissue   CESAREAN SECTION CLASSICAL  1990   COLONOSCOPY  06/06/2020   TUBAL LIGATION  1990    Family History  Problem Relation Age of Onset   High blood pressure Mother    Cataracts Mother    Glaucoma Mother    High blood pressure Father    Cataracts Maternal Grandfather    Glaucoma Maternal Grandfather    Breast cancer Paternal Aunt    Colon cancer Neg Hx    Esophageal cancer Neg Hx    Rectal cancer Neg Hx    Stomach cancer Neg Hx    Inflammatory bowel disease Neg Hx    Liver disease Neg Hx    Pancreatic cancer Neg Hx     Social History   Socioeconomic  History   Marital status: Single    Spouse name: Not on file   Number of children: Not on file   Years of education: Not on file   Highest education level: Not on file  Occupational History   Not on file  Tobacco Use   Smoking status: Never    Passive exposure: Never   Smokeless tobacco: Never  Vaping Use   Vaping status: Never Used  Substance and Sexual Activity   Alcohol use: Yes    Comment: occasional   Drug use: Not Currently    Types: Marijuana   Sexual activity: Not on file  Other Topics Concern   Not on file  Social History Narrative   Not on file   Social Drivers of Health   Financial Resource Strain: Low Risk  (12/01/2023)   Overall Financial Resource Strain (CARDIA)    Difficulty of Paying Living Expenses: Not hard at all  Food Insecurity: No Food Insecurity (12/01/2023)   Hunger Vital Sign    Worried About Running Out of Food in the Last Year: Never true    Ran Out of Food in the Last Year: Never true  Transportation Needs: No Transportation Needs (12/01/2023)   PRAPARE - Administrator, Civil Service (Medical): No    Lack of Transportation (Non-Medical): No  Physical Activity: Insufficiently Active (12/01/2023)   Exercise Vital Sign    Days of Exercise per Week: 1 day    Minutes of Exercise per Session: 30 min  Stress: No Stress Concern Present (12/01/2023)   [employer] of Occupational Health - Occupational Stress Questionnaire    Feeling of Stress : Only a little  Social Connections: Moderately Isolated (12/01/2023)   Social Connection and Isolation Panel    Frequency of Communication with Friends and Family: More than three times a week    Frequency of Social Gatherings with Friends and Family: Once a week    Attends Religious Services: More than 4 times per year    Active Member of [employer] or Organizations: No    Attends Banker Meetings: Never    Marital Status: Never married  Intimate Partner Violence: Not At Risk  (12/01/2023)   Humiliation, Afraid, Rape, and Kick questionnaire    Fear of Current or Ex-Partner: No    Emotionally Abused: No    Physically Abused: No    Sexually Abused: No    Review of Systems  All other systems reviewed and are negative.       Objective    BP 129/80   Pulse 83   Temp 98.3 F (36.8 C) (Oral)   Resp 16   Wt 228 lb (103.4  kg)   LMP 02/27/2016   SpO2 95%   BMI 33.67 kg/m   Physical Exam Vitals and nursing note reviewed.  Constitutional:      General: She is not in acute distress.    Appearance: She is obese.  Cardiovascular:     Rate and Rhythm: Normal rate and regular rhythm.  Pulmonary:     Effort: Pulmonary effort is normal.     Breath sounds: Normal breath sounds.  Abdominal:     Palpations: Abdomen is soft.     Tenderness: There is no abdominal tenderness.  Neurological:     General: No focal deficit present.     Mental Status: She is alert and oriented to person, place, and time.  Psychiatric:        Mood and Affect: Mood and affect normal.         Assessment & Plan:   Essential hypertension  Class 1 obesity due to excess calories with serious comorbidity and body mass index (BMI) of 34.0 to 34.9 in adult     Return in about 4 months (around 01/25/2025) for follow up.   Tanda Raguel SQUIBB, MD

## 2024-09-25 ENCOUNTER — Telehealth: Payer: Self-pay | Admitting: *Deleted

## 2024-09-25 NOTE — Telephone Encounter (Signed)
 [facility], can you tell me why this referral was closed.  Thanks, Gustav

## 2024-10-24 NOTE — Patient Instructions (Incomplete)
  1. Continue Breztri - 2 inhalations twice a day   2. Continue Montelukast 10 mg daily  3. Continue Pantoprazole 40 mg in the morning + famotidine 40  mg in evening  4. If Needed:  A. AIRSUPRA- 2 inhalations or albuterol nebulization every 4-6 hours  B. OTC antihistamine  C. Bepreve eye drops   5. Return to clinic in 12 months or earlier if problem

## 2024-10-24 NOTE — Progress Notes (Signed)
 [address] [place] [address] Dept: [phone]  FOLLOW UP NOTE  Patient ID: Amy Combs, female    DOB: 02-05-1961  Age: 63 y.o. MRN: [id number] Date of Office Visit: 10/25/2024  Assessment  Chief Complaint: Follow-up (No concerns) and Medication Refill  HPI Amy Combs is a 63 year old female who presents to the clinic for follow-up visit.  She was last seen in this clinic on 11/01/2023 by Dr. Kozlow for evaluation of asthma, allergic rhinitis, allergic conjunctivitis, and reflux. Discussed the use of AI scribe software for clinical note transcription with the patient, who gave verbal consent to proceed.  History of Present Illness Amy Combs is a 63 year old female with asthma and reflux who presents for a follow-up visit.  Asthma is reported as well-controlled.  She reports no shortness of breath, wheezing, or coughing. She continues to use Breztri , two puffs twice a day, and montelukast  daily. Airsupra  is used approximately once a week, with anticipation of increased use as the weather changes.  Allergic rhinitis is reported as moderately well controlled. No current allergy symptoms such as runny or stuffy nose, sneezing, or itchy eyes. She occasionally uses Zyrtec  during the spring when pollen levels are high, which she finds effective.  Her last environmental allergy skin testing on 05/11/2016 was positive to grass pollen, ragweed pollen, weed pollen, tree pollen, mold, and dust mite.  Allergic conjunctivitis is reported as moderately well controlled. Beprev eye drops are used for red eyes, which she describes as making her look 'like I was a drug' and causing discomfort in social situations. Relief is achieved with these drops.  She takes pantoprazole  and famotidine  for reflux, and experiences significant symptoms if she misses a dose.  Her current medications are lsited in the chart.     Drug Allergies:  Allergies  Allergen Reactions   Other      Other reaction(s): Eye Redness Dust, ragweed, grass   Pollen Extract Other (See Comments)   Lisinopril Swelling    Physical Exam: BP (!) 140/80   Pulse 82   Temp 98.7 F (37.1 C)   LMP 02/27/2016   SpO2 97%    Physical Exam Vitals reviewed.  Constitutional:      Appearance: Normal appearance.  HENT:     Head: Normocephalic and atraumatic.     Right Ear: Tympanic membrane normal.     Left Ear: Tympanic membrane normal.     Nose:     Comments: Bilateral nares edematous and pale with thin clear nasal drainage noted.  Pharynx normal.  Ears normal.  Eyes normal.    Mouth/Throat:     Pharynx: Oropharynx is clear.  Eyes:     Conjunctiva/sclera: Conjunctivae normal.  Cardiovascular:     Rate and Rhythm: Normal rate and regular rhythm.     Heart sounds: Normal heart sounds. No murmur heard. Pulmonary:     Effort: Pulmonary effort is normal.     Breath sounds: Normal breath sounds.     Comments: Lungs clear to auscultation Musculoskeletal:        General: Normal range of motion.     Cervical back: Normal range of motion and neck supple.  Skin:    General: Skin is warm and dry.  Neurological:     Mental Status: She is alert and oriented to person, place, and time.  Psychiatric:        Mood and Affect: Mood normal.        Behavior: Behavior normal.  Thought Content: Thought content normal.        Judgment: Judgment normal.     Diagnostics: FVC 2.92 which is 92% of predicted value, FEV1 1.75 which is 70% of predicted value.  Spirometry indicates possible mild airway obstruction  Assessment and Plan: 1. Asthma, moderate persistent, well-controlled   2. Seasonal and perennial allergic rhinitis   3. Seasonal allergic conjunctivitis   4. LPRD (laryngopharyngeal reflux disease)     Meds ordered this encounter  Medications   albuterol  (VENTOLIN  HFA) 108 (90 Base) MCG/ACT inhaler    Sig: Inhale 2 puffs into the lungs every 4 (four) hours as needed for wheezing or  shortness of breath.    Dispense:  18 g    Refill:  1   Albuterol -Budesonide  (AIRSUPRA ) 90-80 MCG/ACT AERO    Sig: Inhale 2 puffs into the lungs every 4 (four) hours as needed.    Dispense:  10.7 g    Refill:  1    Please apply copay card [id number] ERW:[id number] PI:[id number]   BREZTRI  AEROSPHERE 160-9-4.8 MCG/ACT AERO inhaler    Sig: Inhale 2 puffs into the lungs 2 (two) times daily. 2 inhalations 2(TWO) Times a day.    Dispense:  32.1 g    Refill:  3    Please dispense 90 day supply.   levalbuterol (XOPENEX) 1.25 MG/3ML nebulizer solution    Sig: Take 1.25 mg by nebulization every 4 (four) hours as needed for wheezing.    Dispense:  90 mL    Refill:  1   montelukast  (SINGULAIR ) 10 MG tablet    Sig: Take 1 tablet (10 mg total) by mouth at bedtime.    Dispense:  180 tablet    Refill:  1    Patient Instructions   1. Continue Breztri  - 2 inhalations twice a day   2. Continue Montelukast  10 mg daily  3. Continue Pantoprazole  40 mg in the morning + famotidine  40  mg in evening  4. If Needed:  A. AIRSUPRA - 2 inhalations or albuterol  nebulization every 4-6 hours  B. OTC antihistamine  C. Bepreve eye drops   5. Return to clinic in 12 months or earlier if problem  Return in about 1 year (around 10/25/2025), or if symptoms worsen or fail to improve.    Thank you for the opportunity to care for this patient.  Please do not hesitate to contact me with questions.  Arlean Mutter, FNP [facility] 

## 2024-10-25 ENCOUNTER — Other Ambulatory Visit: Payer: Self-pay

## 2024-10-25 ENCOUNTER — Encounter: Payer: Self-pay | Admitting: Family Medicine

## 2024-10-25 ENCOUNTER — Ambulatory Visit (INDEPENDENT_AMBULATORY_CARE_PROVIDER_SITE_OTHER): Admitting: Family Medicine

## 2024-10-25 ENCOUNTER — Other Ambulatory Visit: Payer: Self-pay | Admitting: Family Medicine

## 2024-10-25 VITALS — BP 140/80 | HR 82 | Temp 98.7°F

## 2024-10-25 DIAGNOSIS — J3089 Other allergic rhinitis: Secondary | ICD-10-CM | POA: Diagnosis not present

## 2024-10-25 DIAGNOSIS — K219 Gastro-esophageal reflux disease without esophagitis: Secondary | ICD-10-CM | POA: Diagnosis not present

## 2024-10-25 DIAGNOSIS — J302 Other seasonal allergic rhinitis: Secondary | ICD-10-CM

## 2024-10-25 DIAGNOSIS — J4540 Moderate persistent asthma, uncomplicated: Secondary | ICD-10-CM | POA: Diagnosis not present

## 2024-10-25 DIAGNOSIS — H1010 Acute atopic conjunctivitis, unspecified eye: Secondary | ICD-10-CM | POA: Diagnosis not present

## 2024-10-25 MED ORDER — MONTELUKAST SODIUM 10 MG PO TABS: 10.0000 mg | ORAL_TABLET | Freq: Every evening | ORAL | 1 refills | Status: AC

## 2024-10-25 MED ORDER — ALBUTEROL SULFATE HFA 108 (90 BASE) MCG/ACT IN AERS: 2.0000 | INHALATION_SPRAY | RESPIRATORY_TRACT | 1 refills | Status: AC | PRN

## 2024-10-25 MED ORDER — BREZTRI AEROSPHERE 160-9-4.8 MCG/ACT IN AERO: 2.0000 | INHALATION_SPRAY | Freq: Two times a day (BID) | RESPIRATORY_TRACT | 3 refills | Status: AC

## 2024-10-25 MED ORDER — AIRSUPRA 90-80 MCG/ACT IN AERO: 2.0000 | INHALATION_SPRAY | RESPIRATORY_TRACT | 1 refills | Status: DC | PRN

## 2024-10-25 MED ORDER — LEVALBUTEROL HCL 1.25 MG/3ML IN NEBU: 1.2500 mg | INHALATION_SOLUTION | RESPIRATORY_TRACT | 1 refills | Status: DC | PRN

## 2024-10-26 ENCOUNTER — Encounter: Payer: Self-pay | Admitting: Family Medicine

## 2024-10-30 ENCOUNTER — Ambulatory Visit: Payer: No Typology Code available for payment source | Admitting: Allergy and Immunology

## 2024-10-30 NOTE — Telephone Encounter (Signed)
 Please discontinue levalbuterol. Thank you. Continue Airsupra  and please request 2 refills so it will be covered. Thank you so much

## 2024-11-01 ENCOUNTER — Other Ambulatory Visit: Payer: Self-pay | Admitting: Family Medicine

## 2024-11-01 DIAGNOSIS — I10 Essential (primary) hypertension: Secondary | ICD-10-CM

## 2024-11-01 NOTE — Telephone Encounter (Signed)
 Complete

## 2024-11-02 NOTE — Telephone Encounter (Signed)
 great

## 2024-11-06 ENCOUNTER — Ambulatory Visit: Admitting: Allergy and Immunology

## 2024-11-15 ENCOUNTER — Other Ambulatory Visit: Payer: Self-pay | Admitting: Family Medicine

## 2024-11-16 ENCOUNTER — Other Ambulatory Visit: Payer: Self-pay | Admitting: Allergy and Immunology

## 2024-11-20 ENCOUNTER — Other Ambulatory Visit: Payer: Self-pay | Admitting: Allergy and Immunology

## 2024-11-24 ENCOUNTER — Other Ambulatory Visit: Payer: Self-pay | Admitting: Family Medicine

## 2024-11-24 DIAGNOSIS — R21 Rash and other nonspecific skin eruption: Secondary | ICD-10-CM

## 2024-11-28 ENCOUNTER — Other Ambulatory Visit: Payer: Self-pay | Admitting: Family Medicine

## 2024-11-29 ENCOUNTER — Other Ambulatory Visit: Payer: Self-pay | Admitting: Family Medicine

## 2025-01-09 ENCOUNTER — Ambulatory Visit
Admission: RE | Admit: 2025-01-09 | Discharge: 2025-01-09 | Disposition: A | Source: Ambulatory Visit | Attending: Family Medicine | Admitting: Family Medicine

## 2025-01-09 DIAGNOSIS — Z1231 Encounter for screening mammogram for malignant neoplasm of breast: Secondary | ICD-10-CM

## 2025-01-22 ENCOUNTER — Ambulatory Visit: Payer: Self-pay | Admitting: Family Medicine

## 2025-01-25 ENCOUNTER — Ambulatory Visit: Payer: Self-pay | Admitting: Family Medicine

## 2025-01-25 ENCOUNTER — Encounter: Payer: Self-pay | Admitting: Family Medicine

## 2025-01-25 ENCOUNTER — Ambulatory Visit: Admitting: Family Medicine

## 2025-01-25 VITALS — BP 128/68 | HR 102 | Ht 69.0 in | Wt 229.8 lb

## 2025-01-25 DIAGNOSIS — J4521 Mild intermittent asthma with (acute) exacerbation: Secondary | ICD-10-CM

## 2025-01-25 DIAGNOSIS — J101 Influenza due to other identified influenza virus with other respiratory manifestations: Secondary | ICD-10-CM

## 2025-01-25 LAB — POC COVID19/FLU A&B COMBO
Covid Antigen, POC: NEGATIVE
Influenza A Antigen, POC: POSITIVE — AB
Influenza B Antigen, POC: NEGATIVE

## 2025-01-25 MED ORDER — OSELTAMIVIR PHOSPHATE 75 MG PO CAPS: 75.0000 mg | ORAL_CAPSULE | Freq: Two times a day (BID) | ORAL | 0 refills | Status: AC

## 2025-01-25 MED ORDER — PREDNISONE 10 MG (21) PO TBPK: ORAL_TABLET | ORAL | 0 refills | Status: AC

## 2025-01-25 NOTE — Progress Notes (Unsigned)
 "  Established Patient Office Visit  Subjective    Patient ID: Amy Combs, female    DOB: 11-25-1961  Age: 64 y.o. MRN: [id number]  CC:  Chief Complaint  Patient presents with   Cough    Pt reports cough, headache, and trouble breathing. SPO2 87% currently     HPI Amy Combs presents ***  Outpatient Encounter Medications as of 01/25/2025  Medication Sig   albuterol  (PROVENTIL ) (2.5 MG/3ML) 0.083% nebulizer solution    albuterol  (VENTOLIN  HFA) 108 (90 Base) MCG/ACT inhaler Inhale 2 puffs into the lungs every 4 (four) hours as needed for wheezing or shortness of breath.   Albuterol -Budesonide  (AIRSUPRA ) 90-80 MCG/ACT AERO TAKE 2 PUFFS BY MOUTH EVERY 4 HOURS AS NEEDED   amLODipine  (NORVASC ) 10 MG tablet TAKE 1 TABLET BY MOUTH EVERY DAY   BREZTRI  AEROSPHERE 160-9-4.8 MCG/ACT AERO inhaler Inhale 2 puffs into the lungs 2 (two) times daily. 2 inhalations 2(TWO) Times a day.   chlorthalidone  (HYGROTON ) 25 MG tablet TAKE 1 TABLET (25 MG TOTAL) BY MOUTH DAILY. DX CODE: I10   cyclobenzaprine  (FLEXERIL ) 5 MG tablet Take 1 tablet (5 mg total) by mouth 3 (three) times daily as needed for muscle spasms.   meloxicam  (MOBIC ) 15 MG tablet Take 1 tablet (15 mg total) by mouth daily.   Omega 3-6-9 Fatty Acids (OMEGA 3-6-9 COMPLEX PO) Take 1 capsule by mouth in the morning. Krill Omega 50+ with CoQ10   pantoprazole  (PROTONIX ) 40 MG tablet TAKE 1 TABLET (40 MG TOTAL) BY MOUTH IN THE MORNING   PARoxetine  (PAXIL ) 20 MG tablet TAKE 1 TABLET BY MOUTH EVERY DAY   telmisartan  (MICARDIS ) 80 MG tablet TAKE 1 TABLET BY MOUTH EVERY DAY   valACYclovir  (VALTREX ) 1000 MG tablet TAKE 1 TABLET BY MOUTH TWICE A DAY   vitamin B-12 (CYANOCOBALAMIN) 1000 MCG tablet Take 1,000 mcg by mouth in the morning.   Vitamin D , Ergocalciferol , (DRISDOL ) 1.25 MG (50000 UNIT) CAPS capsule Take 1 capsule (50,000 Units total) by mouth every 7 (seven) days.   Bepotastine  Besilate 1.5 % SOLN Place 1 drop into both eyes 2 (two)  times daily as needed.   brimonidine (ALPHAGAN) 0.2 % ophthalmic solution Place 1 drop into the right eye 2 (two) times daily.   cetirizine  (ZYRTEC ) 10 MG tablet Take 1 tablet (10 mg total) by mouth daily as needed for allergies (Can take an extra dose during flare ups.). (Patient not taking: Reported on 01/25/2025)   dorzolamide (TRUSOPT) 2 % ophthalmic solution Place 1 drop into the right eye 3 (three) times daily.   famotidine  (PEPCID ) 40 MG tablet TAKE 1 TABLET BY MOUTH EVERYDAY AT BEDTIME (Patient not taking: Reported on 01/25/2025)   montelukast  (SINGULAIR ) 10 MG tablet Take 1 tablet (10 mg total) by mouth at bedtime. (Patient not taking: Reported on 01/25/2025)   PARoxetine  (PAXIL ) 10 MG tablet TAKE 1 TABLET BY MOUTH EVERY DAY   No facility-administered encounter medications on file as of 01/25/2025.    Past Medical History:  Diagnosis Date   Allergy    Ankle swelling    Asthma    GERD (gastroesophageal reflux disease)    Hypertension    Primary open angle glaucoma (POAG) of right eye, severe stage     Past Surgical History:  Procedure Laterality Date   ABDOMINAL SURGERY  1998   mesh placed for scar tissue   CESAREAN SECTION CLASSICAL  1990   COLONOSCOPY  06/06/2020   TUBAL LIGATION  1990    Family History  Problem Relation Age of Onset   High blood pressure Mother    Cataracts Mother    Glaucoma Mother    High blood pressure Father    Cataracts Maternal Grandfather    Glaucoma Maternal Grandfather    Breast cancer Paternal Aunt    Colon cancer Neg Hx    Esophageal cancer Neg Hx    Rectal cancer Neg Hx    Stomach cancer Neg Hx    Inflammatory bowel disease Neg Hx    Liver disease Neg Hx    Pancreatic cancer Neg Hx     Social History   Socioeconomic History   Marital status: Single    Spouse name: Not on file   Number of children: Not on file   Years of education: Not on file   Highest education level: Not on file  Occupational History   Not on file  Tobacco  Use   Smoking status: Never    Passive exposure: Never   Smokeless tobacco: Never  Vaping Use   Vaping status: Never Used  Substance and Sexual Activity   Alcohol use: Yes    Comment: occasional   Drug use: Not Currently    Types: Marijuana   Sexual activity: Not on file  Other Topics Concern   Not on file  Social History Narrative   Not on file   Social Drivers of Health   Tobacco Use: Low Risk (01/25/2025)   Patient History    Smoking Tobacco Use: Never    Smokeless Tobacco Use: Never    Passive Exposure: Never  Financial Resource Strain: Low Risk (12/01/2023)   Overall Financial Resource Strain (CARDIA)    Difficulty of Paying Living Expenses: Not hard at all  Food Insecurity: No Food Insecurity (12/01/2023)   Hunger Vital Sign    Worried About Running Out of Food in the Last Year: Never true    Ran Out of Food in the Last Year: Never true  Transportation Needs: No Transportation Needs (12/01/2023)   PRAPARE - Administrator, Civil Service (Medical): No    Lack of Transportation (Non-Medical): No  Physical Activity: Insufficiently Active (12/01/2023)   Exercise Vital Sign    Days of Exercise per Week: 1 day    Minutes of Exercise per Session: 30 min  Stress: No Stress Concern Present (12/01/2023)   [employer] of Occupational Health - Occupational Stress Questionnaire    Feeling of Stress : Only a little  Social Connections: Moderately Isolated (12/01/2023)   Social Connection and Isolation Panel    Frequency of Communication with Friends and Family: More than three times a week    Frequency of Social Gatherings with Friends and Family: Once a week    Attends Religious Services: More than 4 times per year    Active Member of [employer] or Organizations: No    Attends Banker Meetings: Never    Marital Status: Never married  Intimate Partner Violence: Not At Risk (12/01/2023)   Humiliation, Afraid, Rape, and Kick questionnaire    Fear of  Current or Ex-Partner: No    Emotionally Abused: No    Physically Abused: No    Sexually Abused: No  Depression (PHQ2-9): Low Risk (09/24/2024)   Depression (PHQ2-9)    PHQ-2 Score: 0  Alcohol Screen: Low Risk (12/01/2023)   Alcohol Screen    Last Alcohol Screening Score (AUDIT): 2  Housing: Unknown (12/01/2023)   Housing Stability Vital Sign    Unable to Pay for Housing  in the Last Year: No    Number of Times Moved in the Last Year: Not on file    Homeless in the Last Year: No  Utilities: Not At Risk (12/01/2023)   AHC Utilities    Threatened with loss of utilities: No  Health Literacy: Adequate Health Literacy (12/01/2023)   B1300 Health Literacy    Frequency of need for help with medical instructions: Never    ROS      Objective    BP 128/68   Pulse (!) 102   Ht 5' 9 (1.753 m)   Wt 229 lb 12.8 oz (104.2 kg)   LMP 02/27/2016   SpO2 (!) 86%   BMI 33.94 kg/m   Physical Exam  {Labs (Optional):23779}    Assessment & Plan:   Influenza A     No follow-ups on file.   Tanda Raguel SQUIBB, MD  "
